# Patient Record
Sex: Male | Born: 1950 | Race: White | Hispanic: No | Marital: Married | Smoking: Former smoker
Health system: Southern US, Community
[De-identification: ages and names within clinical notes are randomized; demographics above are authoritative.]

## PROBLEM LIST (undated history)

## (undated) DIAGNOSIS — K219 Gastro-esophageal reflux disease without esophagitis: Secondary | ICD-10-CM

## (undated) DIAGNOSIS — K921 Melena: Secondary | ICD-10-CM

## (undated) DIAGNOSIS — F411 Generalized anxiety disorder: Secondary | ICD-10-CM

## (undated) DIAGNOSIS — G4730 Sleep apnea, unspecified: Secondary | ICD-10-CM

## (undated) DIAGNOSIS — E785 Hyperlipidemia, unspecified: Secondary | ICD-10-CM

## (undated) DIAGNOSIS — I10 Essential (primary) hypertension: Secondary | ICD-10-CM

## (undated) DIAGNOSIS — T7840XA Allergy, unspecified, initial encounter: Secondary | ICD-10-CM

## (undated) DIAGNOSIS — K429 Umbilical hernia without obstruction or gangrene: Secondary | ICD-10-CM

## (undated) DIAGNOSIS — G4733 Obstructive sleep apnea (adult) (pediatric): Secondary | ICD-10-CM

## (undated) HISTORY — DX: Sleep apnea, unspecified: G47.30

## (undated) HISTORY — DX: Essential (primary) hypertension: I10

## (undated) HISTORY — PX: HAIR TRANSPLANT: SHX1719

## (undated) HISTORY — PX: TONSILLECTOMY AND ADENOIDECTOMY: SUR1326

## (undated) HISTORY — DX: Generalized anxiety disorder: F41.1

## (undated) HISTORY — DX: Allergy, unspecified, initial encounter: T78.40XA

## (undated) HISTORY — PX: OTHER SURGICAL HISTORY: SHX169

## (undated) HISTORY — DX: Gastro-esophageal reflux disease without esophagitis: K21.9

## (undated) HISTORY — DX: Melena: K92.1

## (undated) HISTORY — DX: Obstructive sleep apnea (adult) (pediatric): G47.33

## (undated) HISTORY — PX: EYE SURGERY: SHX253

## (undated) HISTORY — DX: Hyperlipidemia, unspecified: E78.5

## (undated) HISTORY — DX: Umbilical hernia without obstruction or gangrene: K42.9

---

## 2001-05-04 ENCOUNTER — Encounter: Admission: RE | Admit: 2001-05-04 | Discharge: 2001-05-04 | Payer: Self-pay | Admitting: Internal Medicine

## 2001-05-04 ENCOUNTER — Encounter: Payer: Self-pay | Admitting: Internal Medicine

## 2004-11-10 ENCOUNTER — Ambulatory Visit: Payer: Self-pay | Admitting: Internal Medicine

## 2004-12-09 ENCOUNTER — Ambulatory Visit: Payer: Self-pay | Admitting: Gastroenterology

## 2004-12-19 ENCOUNTER — Ambulatory Visit: Payer: Self-pay | Admitting: Gastroenterology

## 2004-12-19 HISTORY — PX: COLONOSCOPY: SHX174

## 2005-11-02 ENCOUNTER — Ambulatory Visit: Payer: Self-pay | Admitting: Internal Medicine

## 2007-06-15 DIAGNOSIS — F411 Generalized anxiety disorder: Secondary | ICD-10-CM

## 2007-06-15 HISTORY — DX: Generalized anxiety disorder: F41.1

## 2007-07-08 ENCOUNTER — Ambulatory Visit: Payer: Self-pay | Admitting: Internal Medicine

## 2007-07-08 DIAGNOSIS — I10 Essential (primary) hypertension: Secondary | ICD-10-CM | POA: Insufficient documentation

## 2007-07-08 DIAGNOSIS — K219 Gastro-esophageal reflux disease without esophagitis: Secondary | ICD-10-CM | POA: Insufficient documentation

## 2007-07-08 DIAGNOSIS — G4733 Obstructive sleep apnea (adult) (pediatric): Secondary | ICD-10-CM

## 2007-07-08 HISTORY — DX: Essential (primary) hypertension: I10

## 2007-07-08 HISTORY — DX: Obstructive sleep apnea (adult) (pediatric): G47.33

## 2007-07-08 HISTORY — DX: Gastro-esophageal reflux disease without esophagitis: K21.9

## 2007-08-01 ENCOUNTER — Ambulatory Visit: Payer: Self-pay | Admitting: Internal Medicine

## 2007-08-01 DIAGNOSIS — K429 Umbilical hernia without obstruction or gangrene: Secondary | ICD-10-CM | POA: Insufficient documentation

## 2007-08-01 HISTORY — DX: Umbilical hernia without obstruction or gangrene: K42.9

## 2007-08-04 ENCOUNTER — Telehealth: Payer: Self-pay | Admitting: Internal Medicine

## 2007-09-05 ENCOUNTER — Ambulatory Visit: Payer: Self-pay | Admitting: Internal Medicine

## 2007-09-06 LAB — CONVERTED CEMR LAB
ALT: 20 units/L (ref 0–53)
AST: 25 units/L (ref 0–37)
Albumin: 4.6 g/dL (ref 3.5–5.2)
Alkaline Phosphatase: 53 units/L (ref 39–117)
BUN: 11 mg/dL (ref 6–23)
Basophils Absolute: 0 10*3/uL (ref 0.0–0.1)
Basophils Relative: 0.6 % (ref 0.0–1.0)
Bilirubin, Direct: 0.2 mg/dL (ref 0.0–0.3)
CO2: 25 meq/L (ref 19–32)
Calcium: 10.1 mg/dL (ref 8.4–10.5)
Chloride: 103 meq/L (ref 96–112)
Creatinine, Ser: 0.9 mg/dL (ref 0.4–1.5)
Eosinophils Absolute: 0.1 10*3/uL (ref 0.0–0.6)
Eosinophils Relative: 2.1 % (ref 0.0–5.0)
GFR calc Af Amer: 112 mL/min
GFR calc non Af Amer: 93 mL/min
Glucose, Bld: 72 mg/dL (ref 70–99)
HCT: 43.5 % (ref 39.0–52.0)
Hemoglobin: 15.2 g/dL (ref 13.0–17.0)
Lymphocytes Relative: 29.6 % (ref 12.0–46.0)
MCHC: 35 g/dL (ref 30.0–36.0)
MCV: 94.7 fL (ref 78.0–100.0)
Monocytes Absolute: 0.6 10*3/uL (ref 0.2–0.7)
Monocytes Relative: 8.7 % (ref 3.0–11.0)
Neutro Abs: 3.9 10*3/uL (ref 1.4–7.7)
Neutrophils Relative %: 59 % (ref 43.0–77.0)
Platelets: 257 10*3/uL (ref 150–400)
Potassium: 4.8 meq/L (ref 3.5–5.1)
RBC: 4.59 M/uL (ref 4.22–5.81)
RDW: 12.9 % (ref 11.5–14.6)
Sodium: 141 meq/L (ref 135–145)
TSH: 0.41 microintl units/mL (ref 0.35–5.50)
Total Bilirubin: 0.8 mg/dL (ref 0.3–1.2)
Total Protein: 7.5 g/dL (ref 6.0–8.3)
WBC: 6.6 10*3/uL (ref 4.5–10.5)

## 2007-10-31 ENCOUNTER — Telehealth: Payer: Self-pay | Admitting: Internal Medicine

## 2007-11-01 ENCOUNTER — Ambulatory Visit: Payer: Self-pay | Admitting: Gastroenterology

## 2007-11-01 LAB — CONVERTED CEMR LAB
ALT: 24 units/L (ref 0–53)
AST: 26 units/L (ref 0–37)
Albumin: 4.7 g/dL (ref 3.5–5.2)
Alkaline Phosphatase: 53 units/L (ref 39–117)
BUN: 10 mg/dL (ref 6–23)
Basophils Absolute: 0.1 10*3/uL (ref 0.0–0.1)
Basophils Relative: 1.5 % — ABNORMAL HIGH (ref 0.0–1.0)
Bilirubin, Direct: 0.1 mg/dL (ref 0.0–0.3)
CO2: 27 meq/L (ref 19–32)
Calcium: 9.9 mg/dL (ref 8.4–10.5)
Chloride: 100 meq/L (ref 96–112)
Creatinine, Ser: 0.9 mg/dL (ref 0.4–1.5)
Eosinophils Absolute: 0.1 10*3/uL (ref 0.0–0.6)
Eosinophils Relative: 1.5 % (ref 0.0–5.0)
GFR calc Af Amer: 112 mL/min
GFR calc non Af Amer: 93 mL/min
Glucose, Bld: 112 mg/dL — ABNORMAL HIGH (ref 70–99)
HCT: 42.5 % (ref 39.0–52.0)
Hemoglobin: 15.1 g/dL (ref 13.0–17.0)
INR: 1.1 — ABNORMAL HIGH (ref 0.8–1.0)
Lymphocytes Relative: 20.7 % (ref 12.0–46.0)
MCHC: 35.6 g/dL (ref 30.0–36.0)
MCV: 94 fL (ref 78.0–100.0)
Monocytes Absolute: 0.5 10*3/uL (ref 0.2–0.7)
Monocytes Relative: 7.6 % (ref 3.0–11.0)
Neutro Abs: 4.7 10*3/uL (ref 1.4–7.7)
Neutrophils Relative %: 68.7 % (ref 43.0–77.0)
Platelets: 210 10*3/uL (ref 150–400)
Potassium: 4.2 meq/L (ref 3.5–5.1)
Prothrombin Time: 12.8 s (ref 10.9–13.3)
RBC: 4.53 M/uL (ref 4.22–5.81)
RDW: 12.9 % (ref 11.5–14.6)
Sed Rate: 16 mm/hr (ref 0–20)
Sodium: 136 meq/L (ref 135–145)
Tissue Transglutaminase Ab, IgA: 0.8 units (ref <7)
Total Bilirubin: 0.9 mg/dL (ref 0.3–1.2)
Total Protein: 7.7 g/dL (ref 6.0–8.3)
WBC: 6.8 10*3/uL (ref 4.5–10.5)

## 2007-11-03 ENCOUNTER — Encounter: Payer: Self-pay | Admitting: Gastroenterology

## 2007-11-18 ENCOUNTER — Encounter: Payer: Self-pay | Admitting: Internal Medicine

## 2008-02-20 ENCOUNTER — Telehealth: Payer: Self-pay | Admitting: Gastroenterology

## 2008-06-13 ENCOUNTER — Telehealth: Payer: Self-pay | Admitting: Internal Medicine

## 2008-09-13 ENCOUNTER — Telehealth: Payer: Self-pay | Admitting: Internal Medicine

## 2008-10-26 HISTORY — PX: UMBILICAL HERNIA REPAIR: SHX196

## 2008-10-31 ENCOUNTER — Ambulatory Visit: Payer: Self-pay | Admitting: Internal Medicine

## 2008-10-31 LAB — CONVERTED CEMR LAB
ALT: 32 units/L (ref 0–53)
AST: 37 units/L (ref 0–37)
Albumin: 4.4 g/dL (ref 3.5–5.2)
Alkaline Phosphatase: 43 units/L (ref 39–117)
BUN: 11 mg/dL (ref 6–23)
Basophils Absolute: 0.1 10*3/uL (ref 0.0–0.1)
Basophils Relative: 0.9 % (ref 0.0–3.0)
Bilirubin, Direct: 0.1 mg/dL (ref 0.0–0.3)
Blood in Urine, dipstick: NEGATIVE
CO2: 29 meq/L (ref 19–32)
Calcium: 9.4 mg/dL (ref 8.4–10.5)
Chloride: 102 meq/L (ref 96–112)
Cholesterol: 215 mg/dL (ref 0–200)
Creatinine, Ser: 0.9 mg/dL (ref 0.4–1.5)
Creatinine,U: 254.6 mg/dL
Direct LDL: 153.7 mg/dL
Eosinophils Absolute: 0.2 10*3/uL (ref 0.0–0.7)
Eosinophils Relative: 2.9 % (ref 0.0–5.0)
GFR calc Af Amer: 112 mL/min
GFR calc non Af Amer: 92 mL/min
Glucose, Bld: 104 mg/dL — ABNORMAL HIGH (ref 70–99)
Glucose, Urine, Semiquant: NEGATIVE
HCT: 40.2 % (ref 39.0–52.0)
HDL: 41.8 mg/dL (ref >39.0)
Hemoglobin: 14.1 g/dL (ref 13.0–17.0)
Hgb A1c MFr Bld: 5.2 % (ref 4.6–6.0)
Ketones, urine, test strip: NEGATIVE
Lymphocytes Relative: 23.9 % (ref 12.0–46.0)
MCHC: 35.1 g/dL (ref 30.0–36.0)
MCV: 94 fL (ref 78.0–100.0)
Microalb Creat Ratio: 22.8 mg/g (ref 0.0–30.0)
Microalb, Ur: 5.8 mg/dL — ABNORMAL HIGH (ref 0.0–1.9)
Monocytes Absolute: 0.7 10*3/uL (ref 0.1–1.0)
Monocytes Relative: 11.6 % (ref 3.0–12.0)
Neutro Abs: 3.3 10*3/uL (ref 1.4–7.7)
Neutrophils Relative %: 60.7 % (ref 43.0–77.0)
Nitrite: NEGATIVE
PSA: 0.67 ng/mL (ref 0.10–4.00)
Platelets: 180 10*3/uL (ref 150–400)
Potassium: 4.1 meq/L (ref 3.5–5.1)
RBC: 4.28 M/uL (ref 4.22–5.81)
RDW: 12.3 % (ref 11.5–14.6)
Sodium: 139 meq/L (ref 135–145)
Specific Gravity, Urine: 1.015
TSH: 1.07 microintl units/mL (ref 0.35–5.50)
Total Bilirubin: 1.1 mg/dL (ref 0.3–1.2)
Total CHOL/HDL Ratio: 5.1
Total Protein: 7.5 g/dL (ref 6.0–8.3)
Triglycerides: 149 mg/dL (ref 0–149)
Urobilinogen, UA: 0.2
VLDL: 30 mg/dL (ref 0–40)
WBC Urine, dipstick: NEGATIVE
WBC: 5.7 10*3/uL (ref 4.5–10.5)
pH: 6.5

## 2008-11-20 ENCOUNTER — Telehealth: Payer: Self-pay | Admitting: Internal Medicine

## 2009-01-28 ENCOUNTER — Encounter: Payer: Self-pay | Admitting: Internal Medicine

## 2009-01-31 ENCOUNTER — Ambulatory Visit (HOSPITAL_BASED_OUTPATIENT_CLINIC_OR_DEPARTMENT_OTHER): Admission: RE | Admit: 2009-01-31 | Discharge: 2009-01-31 | Payer: Self-pay | Admitting: Surgery

## 2009-02-11 ENCOUNTER — Telehealth (INDEPENDENT_AMBULATORY_CARE_PROVIDER_SITE_OTHER): Payer: Self-pay | Admitting: *Deleted

## 2009-02-15 ENCOUNTER — Ambulatory Visit: Payer: Self-pay | Admitting: Family Medicine

## 2009-02-15 DIAGNOSIS — K921 Melena: Secondary | ICD-10-CM

## 2009-02-15 HISTORY — DX: Melena: K92.1

## 2009-02-15 LAB — CONVERTED CEMR LAB
ALT: 23 units/L (ref 0–53)
AST: 23 units/L (ref 0–37)
Albumin: 4 g/dL (ref 3.5–5.2)
Alkaline Phosphatase: 44 units/L (ref 39–117)
Basophils Absolute: 0 10*3/uL (ref 0.0–0.1)
Basophils Relative: 0.4 % (ref 0.0–3.0)
Bilirubin, Direct: 0.1 mg/dL (ref 0.0–0.3)
Eosinophils Absolute: 0.1 10*3/uL (ref 0.0–0.7)
Eosinophils Relative: 2.4 % (ref 0.0–5.0)
HCT: 37.6 % — ABNORMAL LOW (ref 39.0–52.0)
Hemoglobin: 13 g/dL (ref 13.0–17.0)
INR: 1 (ref 0.8–1.0)
Lymphocytes Relative: 26.4 % (ref 12.0–46.0)
Lymphs Abs: 1.6 10*3/uL (ref 0.7–4.0)
MCHC: 34.5 g/dL (ref 30.0–36.0)
MCV: 95 fL (ref 78.0–100.0)
Monocytes Absolute: 0.5 10*3/uL (ref 0.1–1.0)
Monocytes Relative: 8.2 % (ref 3.0–12.0)
Neutro Abs: 4 10*3/uL (ref 1.4–7.7)
Neutrophils Relative %: 62.6 % (ref 43.0–77.0)
Platelets: 231 10*3/uL (ref 150.0–400.0)
Prothrombin Time: 11 s (ref 10.9–13.3)
RBC: 3.96 M/uL — ABNORMAL LOW (ref 4.22–5.81)
RDW: 12.8 % (ref 11.5–14.6)
Total Bilirubin: 0.9 mg/dL (ref 0.3–1.2)
Total Protein: 7.4 g/dL (ref 6.0–8.3)
WBC: 6.2 10*3/uL (ref 4.5–10.5)
aPTT: 30.1 s — ABNORMAL HIGH (ref 21.7–28.8)

## 2009-04-11 ENCOUNTER — Ambulatory Visit: Payer: Self-pay | Admitting: Internal Medicine

## 2009-04-11 DIAGNOSIS — E785 Hyperlipidemia, unspecified: Secondary | ICD-10-CM

## 2009-04-11 HISTORY — DX: Hyperlipidemia, unspecified: E78.5

## 2009-06-18 ENCOUNTER — Encounter: Payer: Self-pay | Admitting: Internal Medicine

## 2009-07-24 ENCOUNTER — Telehealth: Payer: Self-pay | Admitting: Internal Medicine

## 2009-07-24 ENCOUNTER — Encounter: Payer: Self-pay | Admitting: Internal Medicine

## 2009-07-24 ENCOUNTER — Ambulatory Visit (HOSPITAL_BASED_OUTPATIENT_CLINIC_OR_DEPARTMENT_OTHER): Admission: RE | Admit: 2009-07-24 | Discharge: 2009-07-24 | Payer: Self-pay | Admitting: Orthopedic Surgery

## 2010-05-09 ENCOUNTER — Ambulatory Visit: Payer: Self-pay | Admitting: Internal Medicine

## 2010-07-17 ENCOUNTER — Telehealth: Payer: Self-pay | Admitting: *Deleted

## 2010-07-28 ENCOUNTER — Telehealth: Payer: Self-pay | Admitting: *Deleted

## 2010-10-07 ENCOUNTER — Telehealth: Payer: Self-pay | Admitting: Internal Medicine

## 2010-10-07 ENCOUNTER — Ambulatory Visit: Payer: Self-pay | Admitting: Internal Medicine

## 2010-10-07 LAB — CONVERTED CEMR LAB
BUN: 20 mg/dL (ref 6–23)
Bilirubin, Direct: 0.1 mg/dL (ref 0.0–0.3)
Blood, UA: NEGATIVE
CO2: 32 meq/L (ref 19–32)
Chloride: 102 meq/L (ref 96–112)
Cholesterol: 194 mg/dL (ref 0–200)
Creatinine, Ser: 0.9 mg/dL (ref 0.4–1.5)
Eosinophils Absolute: 0.2 10*3/uL (ref 0.0–0.7)
Glucose, Bld: 92 mg/dL (ref 70–99)
Leukocytes, UA: NEGATIVE
MCHC: 34.5 g/dL (ref 30.0–36.0)
MCV: 87.5 fL (ref 78.0–100.0)
Monocytes Absolute: 0.9 10*3/uL (ref 0.1–1.0)
Neutrophils Relative %: 63 % (ref 43.0–77.0)
Nitrite: NEGATIVE
PSA: 0.67 ng/mL (ref 0.10–4.00)
Platelets: 177 10*3/uL (ref 150.0–400.0)
Total Bilirubin: 0.8 mg/dL (ref 0.3–1.2)
Total Protein, Urine: NEGATIVE mg/dL
Total Protein: 6.9 g/dL (ref 6.0–8.3)
Triglycerides: 126 mg/dL (ref 0.0–149.0)

## 2010-10-15 ENCOUNTER — Ambulatory Visit: Payer: Self-pay | Admitting: Internal Medicine

## 2010-10-22 ENCOUNTER — Encounter: Payer: Self-pay | Admitting: Gastroenterology

## 2010-11-25 NOTE — Progress Notes (Signed)
Summary: refill Sertraline HCL  Phone Note Refill Request Call back at [phone] Message from:  spouse---live call  Refills Requested: Medication #1:  SERTRALINE HCL 100 MG TABS 1 by mouth daily [facility]-- wife stated that [facility] does not have anymore refills on file.  Initial call taken by: Warnell Forester,  July 28, 2010 12:55 PM    Prescriptions: SERTRALINE HCL 100 MG TABS (SERTRALINE HCL) 1 by mouth daily  #90 x 2   Entered by:   Kathrynn Speed CMA   Authorized by:   Birdie Sons MD   Signed by:   Kathrynn Speed CMA on 07/28/2010   Method used:   Faxed to ...       [facility] (mail-order)             , [place]         Ph: [phone]       Fax: [phone]   RxID:   [id number]

## 2010-11-25 NOTE — Assessment & Plan Note (Signed)
Summary: DEPRESSION/NJR   Vital Signs:  Patient profile:   60 year old male Height:      70.5 inches (179.07 cm) Weight:      202 pounds (91.82 kg) BMI:     28.68 Temp:     98.5 degrees F (36.94 degrees C) oral Pulse rate:   88 / minute BP sitting:   160 / 96  (left arm) Cuff size:   regular  Vitals Entered By: Josph Macho RMA (May 09, 2010 11:47 AM) CC: Depression/ CF Is Patient Diabetic? No   CC:  Depression/ CF.  History of Present Illness: a lot of stress anxiety wife with breast CA working in [place]--has fallen in love with a [place] woman  not taking amlodipine-not monitoring BP  All other systems reviewed and were negative   Current Problems (verified): 1)  Hyperlipidemia  (ICD-272.4) 2)  Hematochezia  (ICD-578.1) 3)  Diarrhea, Infectious  (ICD-009.2) 4)  Hernia, Umbilical  (ICD-553.1) 5)  Obstructive Sleep Apnea  (ICD-327.23) 6)  Hypertension  (ICD-401.9) 7)  Gerd  (ICD-530.81) 8)  Anxiety  (ICD-300.00)  Current Medications (verified): 1)  Sertraline Hcl 50 Mg Tabs (Sertraline Hcl) .... One By Mouth Daily 2)  Nexium 40 Mg  Cpdr (Esomeprazole Magnesium) .Marland Kitchen.. 1 Once Daily 3)  Amlodipine Besylate 5 Mg  Tabs (Amlodipine Besylate) .... One By Mouth Daily  Allergies (verified): 1)  ! * Horse Serum  Past History:  Past Medical History: Last updated: 04/11/2009 Cough Genital Herpes Anxiety GERD Hypertension Hyperlipidemia  Past Surgical History: Last updated: 02/15/2009 Denies surgical history umbilical hernia repair 4/10  Family History: Last updated: 04/11/2009 Family History of MS Family History of ALS--father Family History Other cancer - esophageal-mother no known hx of htn mother MI in 67s  Social History: Last updated: 09/05/2007 Occupation: Married Former Smoker Alcohol use-yes Regular exercise-no works for [employer] "all of the time"  Risk Factors: Alcohol Use: 3 (09/05/2007) Exercise: no (06/15/2007)  Risk  Factors: Smoking Status: quit (06/15/2007)  Physical Exam  General:  alert and well-developed.   Head:  normocephalic and atraumatic.   Eyes:  pupils equal and pupils round.   Ears:  R ear normal and L ear normal.   Neck:  No deformities, masses, or tenderness noted. Chest Wall:  No deformities, masses, tenderness or gynecomastia noted. Msk:  No deformity or scoliosis noted of thoracic or lumbar spine.   Neurologic:  cranial nerves II-XII intact and gait normal.   Skin:  turgor normal and color normal.   Psych:  normally interactive and good eye contact.     Impression & Recommendations:  Problem # 1:  ANXIETY (ICD-300.00) agitation and anxiety lots of emotional stressors side effects of meds discussed His updated medication list for this problem includes:    Sertraline Hcl 100 Mg Tabs (Sertraline hcl) .Marland Kitchen... 1 by mouth daily    Lorazepam 0.5 Mg Tabs (Lorazepam) .Marland Kitchen... Take 1 tablet by mouth once a day as needed anxiety  Problem # 2:  HYPERTENSION (ICD-401.9) noncompliant with meds---he will resume His updated medication list for this problem includes:    Amlodipine Besylate 5 Mg Tabs (Amlodipine besylate) ..... One by mouth daily  Complete Medication List: 1)  Sertraline Hcl 100 Mg Tabs (Sertraline hcl) .Marland Kitchen.. 1 by mouth daily 2)  Nexium 40 Mg Cpdr (Esomeprazole magnesium) .Marland Kitchen.. 1 once daily 3)  Amlodipine Besylate 5 Mg Tabs (Amlodipine besylate) .... One by mouth daily 4)  Lorazepam 0.5 Mg Tabs (Lorazepam) .... Take 1 tablet by mouth  once a day as needed anxiety Prescriptions: LORAZEPAM 0.5 MG TABS (LORAZEPAM) Take 1 tablet by mouth once a day as needed anxiety  #30 x 1   Entered and Authorized by:   Birdie Sons MD   Signed by:   Birdie Sons MD on 05/09/2010   Method used:   Print then Give to Patient   RxID:   [id number] SERTRALINE HCL 100 MG TABS (SERTRALINE HCL) 1 by mouth daily  #90 x 3   Entered and Authorized by:   Birdie Sons MD   Signed by:   Birdie Sons MD  on 05/09/2010   Method used:   Electronically to        [facility] [phone]* (retail)       [address]       [place], [place]  [address]       Ph: [phone] or [phone]       Fax: [phone]   RxID:   [id number]

## 2010-11-25 NOTE — Progress Notes (Signed)
Summary: Pt says [facility] sending req to [facility] Note Call from Patient   Caller: Patient Summary of Call: Pt called and said [facility] in sending refill req  for Sertraline, Nexium and Amlodipine. Pt just wanted to make Dr. Cato Mulligan aware.  Initial call taken by: Lucy Antigua,  July 17, 2010 2:54 PM  Follow-up for Phone Call        Pt aware that he should have refills on zoloft but the other rx's have been sent to [facility]. Rx's sent to [facility]. Follow-up by: Romualdo Bolk, CMA (AAMA),  July 17, 2010 2:58 PM    Prescriptions: NEXIUM 40 MG  CPDR (ESOMEPRAZOLE MAGNESIUM) 1 once daily  #90 x 3   Entered by:   Romualdo Bolk, CMA (AAMA)   Authorized by:   Birdie Sons MD   Signed by:   Romualdo Bolk, CMA (AAMA) on 07/17/2010   Method used:   Faxed to ...       [facility] (retail)             ,          Ph: [phone]       Fax: [phone]   RxID:   [id number] AMLODIPINE BESYLATE 5 MG  TABS (AMLODIPINE BESYLATE) one by mouth daily  #90 x 3   Entered by:   Romualdo Bolk, CMA (AAMA)   Authorized by:   Birdie Sons MD   Signed by:   Romualdo Bolk, CMA (AAMA) on 07/17/2010   Method used:   Faxed to ...       [facility]* (retail)             ,          Ph: [phone]       Fax: [phone]   RxID:   [id number]

## 2010-11-27 NOTE — Assessment & Plan Note (Signed)
Summary: cpx---will fast per dr Lateefah Mallery//ccm   Vital Signs:  Patient profile:   60 year old male Height:      71 inches Weight:      192 pounds Temp:     98.3 degrees F oral Pulse rate:   92 / minute BP sitting:   102 / 74  (left arm) Cuff size:   regular  Vitals Entered By: Alfred Levins, CMA (October 15, 2010 10:34 AM) CC: cpx   CC:  cpx.  History of Present Illness: CPX  Current Problems (verified): 1)  Hyperlipidemia  (ICD-272.4) 2)  Hematochezia  (ICD-578.1) 3)  Hernia, Umbilical  (ICD-553.1) 4)  Obstructive Sleep Apnea  (ICD-327.23) 5)  Hypertension  (ICD-401.9) 6)  Gerd  (ICD-530.81) 7)  Anxiety  (ICD-300.00)  Current Medications (verified): 1)  Nexium 40 Mg  Cpdr (Esomeprazole Magnesium) .Marland Kitchen.. 1 Once Daily 2)  Amlodipine Besylate 5 Mg  Tabs (Amlodipine Besylate) .... One By Mouth Daily  Allergies (verified): 1)  ! * Horse Serum  Past History:  Past Medical History: Last updated: 04/11/2009 Cough Genital Herpes Anxiety GERD Hypertension Hyperlipidemia  Past Surgical History: Last updated: 02/15/2009 Denies surgical history umbilical hernia repair 4/10  Family History: Last updated: 10/15/2010 Family History of MS Family History of ALS--father Family History Other cancer - esophageal-mother no known hx of htn mother MI in 48s  Social History: Last updated: 10/15/2010 Occupation: Married--- Former Smoker Alcohol use-yes hx of ABUSE- functioning alcoholic Regular exercise-no works for [employer] "all of the time"--retired VF---now works as Research scientist (medical)  Risk Factors: Alcohol Use: 3 (09/05/2007) Exercise: no (06/15/2007)  Risk Factors: Smoking Status: quit (06/15/2007)  Family History: Family History of MS Family History of ALS--father Family History Other cancer - esophageal-mother no known hx of htn mother MI in 47s  Social History: Occupation: Married--- Former Smoker Alcohol use-yes hx of ABUSE- functioning  alcoholic Regular exercise-no works for [employer] "all of the time"--retired VF---now works as Research scientist (medical)  Physical Exam  General:  alert and well-developed.   Head:  normocephalic and atraumatic.   Eyes:  pupils equal and pupils round.   Neck:  No deformities, masses, or tenderness noted. Heart:  normal rate and regular rhythm.   Abdomen:  small incisional scar from recent local hernia repair. No signs of secondary infection. Abdomen soft nontender. No erythema. Skin:  turgor normal and color normal.     Impression & Recommendations:  Problem # 1:  Preventive Health Care (ICD-V70.0) health maint UTD  Problem # 2:  HYPERLIPIDEMIA (ICD-272.4) no Rx at this time Labs Reviewed: SGOT: 17 (10/07/2010)   SGPT: 16 (10/07/2010)   HDL:29.40 (10/07/2010), 41.8 (10/31/2008)  LDL:139 (10/07/2010), DEL (10/31/2008)  Chol:194 (10/07/2010), 215 (10/31/2008)  Trig:126.0 (10/07/2010), 149 (10/31/2008)  Problem # 3:  GERD (ICD-530.81)  chronic recurent GERD Fhx of esophageal CA---reasonable to send for EGD His updated medication list for this problem includes:    Nexium 40 Mg Cpdr (Esomeprazole magnesium) .Marland Kitchen... 1 once daily  Orders: Gastroenterology Referral (GI)  Problem # 4:  HYPERTENSION (ICD-401.9) great control decrease dose to 1/2 His updated medication list for this problem includes:    Amlodipine Besylate 5 Mg Tabs (Amlodipine besylate) .Marland Kitchen... 1/2 by mouth daily  BP today: 102/74 Prior BP: 160/96 (05/09/2010)  Labs Reviewed: K+: 4.7 (10/07/2010) Creat: : 0.9 (10/07/2010)   Chol: 194 (10/07/2010)   HDL: 29.40 (10/07/2010)   LDL: 139 (10/07/2010)   TG: 126.0 (10/07/2010)  Complete Medication List: 1)  Nexium 40 Mg Cpdr (Esomeprazole magnesium) .Marland KitchenMarland KitchenMarland Kitchen  1 once daily 2)  Amlodipine Besylate 5 Mg Tabs (Amlodipine besylate) .... 1/2 by mouth daily   Patient Instructions: 1)  We will call you about EGD   Orders Added: 1)  Gastroenterology Referral [GI] 2)  Est. Patient 40-64  years [99396] 3)  Est. Patient Level III [40981]

## 2010-11-27 NOTE — Progress Notes (Signed)
Summary: wants pancreas lab  Phone Note Call from Patient Call back at [phone]   Caller: Patient---live call Summary of Call: pt would like to know if pancreas lab is incuded with physical labs. Initial call taken by: Warnell Forester,  October 07, 2010 10:02 AM  Follow-up for Phone Call        no.  if he has questions I'll discuss at time of CPX---will draw all labs at that time Follow-up by: Birdie Sons MD,  October 07, 2010 2:10 PM  Additional Follow-up for Phone Call Additional follow up Details #1::        pt informed. Additional Follow-up by: Warnell Forester,  October 07, 2010 2:35 PM

## 2010-11-27 NOTE — Letter (Signed)
Summary: New Patient letter  [facility]  [address] [place], [place] [address]   Phone: [phone]  Fax: [phone]       10/22/2010 MRN: [id number]  [name] [address] [place], [place]  [address]  Dear [name],  Welcome to the Gastroenterology Division at [facility].    You are scheduled to see Dr.  Russella Dar on 12-02-10 at 2:30pm on the 3rd floor at [facility] [address]  We ask that you try to arrive at our office 15 minutes prior to your appointment time to allow for check-in.  We would like you to complete the enclosed self-administered evaluation form prior to your visit and bring it with you on the day of your appointment.  We will review it with you.  Also, please bring a complete list of all your medications or, if you prefer, bring the medication bottles and we will list them.  Please bring your insurance card so that we may make a copy of it.  If your insurance requires a referral to see a specialist, please bring your referral form from your primary care physician.  Co-payments are due at the time of your visit and may be paid by cash, check or credit card.     Your office visit will consist of a consult with your physician (includes a physical exam), any laboratory testing he/she may order, scheduling of any necessary diagnostic testing (e.g. x-ray, ultrasound, CT-scan), and scheduling of a procedure (e.g. Endoscopy, Colonoscopy) if required.  Please allow enough time on your schedule to allow for any/all of these possibilities.    If you cannot keep your appointment, please call [phone] to cancel or reschedule prior to your appointment date.  This allows [place] the opportunity to schedule an appointment for another patient in need of care.  If you do not cancel or reschedule by 5 p.m. the business day prior to your appointment date, you will be charged a $50.00 late cancellation/no-show fee.    Thank you for choosing  [facility] for your medical needs.  We appreciate the opportunity to care for you.  Please visit [place] at our website  to learn more about our practice.                     Sincerely,                                                             The Gastroenterology Division

## 2010-12-02 ENCOUNTER — Ambulatory Visit (INDEPENDENT_AMBULATORY_CARE_PROVIDER_SITE_OTHER): Payer: BC Managed Care – PPO | Admitting: Gastroenterology

## 2010-12-02 ENCOUNTER — Encounter: Payer: Self-pay | Admitting: Gastroenterology

## 2010-12-02 DIAGNOSIS — K219 Gastro-esophageal reflux disease without esophagitis: Secondary | ICD-10-CM

## 2010-12-03 NOTE — Procedures (Signed)
Summary: Colonoscopy   Colonoscopy  Procedure date:  12/19/2004  Findings:      Results: Diverticulosis.       Pathology:  Hyperplastic polyp.     Location:  [facility].    Procedures Next Due Date:    Colonoscopy: 12/2009 Patient Name: [name], [name] MRN:  Procedure Procedures: Colonoscopy CPT: 04540.    with Hot Biopsy(s)CPT: Z451292.  Personnel: Endoscopist: Venita Lick. Russella Dar, MD, Clementeen Graham.  Referred By: Valetta Mole Swords, MD.  Exam Location: Exam performed in Outpatient Clinic. Outpatient  Patient Consent: Procedure, Alternatives, Risks and Benefits discussed, consent obtained, from patient. Consent was obtained by the RN.  Indications  Average Risk Screening Routine.  History  Current Medications: Patient is not currently taking Coumadin.  Pre-Exam Physical: Performed Dec 19, 2004. Entire physical exam was normal.  Exam Exam: Extent of exam reached: Cecum, extent intended: Cecum.  The cecum was identified by appendiceal orifice and IC valve. Colon retroflexion performed. ASA Classification: II. Tolerance: excellent.  Monitoring: Pulse and BP monitoring, Oximetry used. Supplemental O2 given.  Colon Prep Used Miralax for colon prep. Prep results: fair, adequate exam.  Sedation Meds: Patient assessed and found to be appropriate for moderate (conscious) sedation. Fentanyl 75 mcg. given IV. Versed 9 mg. given IV.  Findings DIVERTICULOSIS: Transverse Colon. Not bleeding. ICD9: Diverticulosis: 562.10. Comments: mild and widely scattered.  NORMAL EXAM: Splenic Flexure to Sigmoid Colon.  NORMAL EXAM: Cecum to Hepatic Flexure.  MULTIPLE POLYPS: Rectum. minimum size 2 mm, maximum size 2 mm. Procedure:  hot biopsy, removed, Polyp retrieved, 3 polyps Polyps sent to pathology. ICD9: Colon Polyps: 211.3.   Assessment  Diagnoses: 562.10: Diverticulosis.  211.3: Colon Polyps.   Events  Unplanned Interventions: No intervention was required.    Unplanned Events: There were no complications. Plans  Post Exam Instructions: No aspirin or non-steroidal containing medications: 2 weeks.  Medication Plan: Await pathology. Continue current medications.  Patient Education: Patient given standard instructions for: Polyps. Diverticulosis.  Disposition: After procedure patient sent to recovery. After recovery patient sent home.  Scheduling/Referral: Colonoscopy, to [facility] T. Russella Dar, MD, [facility], if polyp(s) adenomatous, around Dec 19, 2009.    This report was created from the original endoscopy report, which was reviewed and signed by the above listed endoscopist.    cc: Valetta Mole. Swords, MD

## 2010-12-11 NOTE — Assessment & Plan Note (Signed)
Summary: SCREEN FOR ENDO CHRONIC GERD/YF--TERRY [phone]/MAILER SENT C...   History of Present Illness Visit Type: follow up Primary GI MD: Elie Goody MD [facility] Primary Provider: Birdie Sons, MD Requesting Provider: Birdie Sons, MD Chief Complaint: GERD, family hx of esophageal cancer History of Present Illness:   This is a 60 year old man with chronic GERD, who is here today with his wife. He relates problems with reflux for approximately 30-40 years. He states he has taken some type of medication for about 20 years. Recently he has been maintained on Nexium 40 mg daily with excellent symptom control. His mother has a history of esophageal cancer, type unknown.   GI Review of Systems      Denies abdominal pain, acid reflux, belching, bloating, chest pain, dysphagia with liquids, dysphagia with solids, heartburn, loss of appetite, nausea, vomiting, vomiting blood, weight loss, and  weight gain.        Denies anal fissure, black tarry stools, change in bowel habit, constipation, diarrhea, diverticulosis, fecal incontinence, heme positive stool, hemorrhoids, irritable bowel syndrome, jaundice, light color stool, liver problems, rectal bleeding, and  rectal pain.   Current Medications (verified): 1)  Nexium 40 Mg  Cpdr (Esomeprazole Magnesium) .Marland Kitchen.. 1 Once Daily 2)  Amlodipine Besylate 5 Mg  Tabs (Amlodipine Besylate) .... 1/2 By Mouth Daily 3)  Sertraline Hcl 100 Mg Tabs (Sertraline Hcl) .... Take 1 Tablet By Mouth Once Daily  Allergies (verified): 1)  ! * Horse Serum  Past History:  Past Medical History: Cough Genital Herpes Anxiety GERD Hypertension Hyperlipidemia Diverticulosis Alcoholism Sleep Apnea Hyperplastic colon polyp 2006  Past Surgical History: Reviewed history from 11/28/2010 and no changes required. umbilical hernia repair 4/10 T & A Lasik eye surgery Hair transplant  Family History: Reviewed history from 10/15/2010 and no changes required. 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         The pertinent positives and negatives are noted as above and in the HPI. All other ROS were reviewed and were negative.   Vital Signs:  Patient profile:   60 year old male Height:      71 inches Weight:      198.50 pounds BMI:     27.79 Pulse rate:   68 / minute Pulse rhythm:   regular BP sitting:   110 / 72  (left arm) Cuff size:   regular  Vitals Entered By: June McMurray CMA Duncan Dull) (December 02, 2010 2:42 PM)  Physical Exam  General:  Well developed, well nourished, no acute distress. Head:  Normocephalic and atraumatic. Eyes:  PERRLA, no icterus. Ears:  Normal auditory acuity. Mouth:  No deformity or lesions, dentition normal. Lungs:  Clear throughout to auscultation. Heart:  Regular rate and rhythm; no murmurs, rubs,  or bruits. Abdomen:  Soft, nontender and nondistended. No masses, hepatosplenomegaly or hernias noted. Normal bowel sounds. Pulses:  Normal pulses noted. Extremities:  No clubbing, cyanosis, edema or deformities noted. Neurologic:  Alert and  oriented x4;  grossly normal neurologically. Psych:  Alert and cooperative. Normal mood and affect.  Impression & Recommendations:  Problem # 1:  GERD (ICD-530.81)  Chronic GERD. Continue standard antireflux measures and Nexium 40 mg daily. Screen for Barrett's esophagus. The risks, benefits and alternatives to endoscopy with possible biopsy and possible dilation were discussed with the patient and they consent to proceed. The procedure will be scheduled  electively. Orders: EGD (EGD)  Problem # 2:  SCREENING COLORECTAL-CANCER (ICD-V76.51) Average risk for colorectal cancer. Recommend screening colonoscopy in February 2016.  Patient Instructions: 1)  Upper Endoscopy brochure given.  2)  Avoid foods high in acid content ( tomatoes, citrus juices, spicy foods) . Avoid eating within 3 to 4 hours of lying down or before exercising. Do not over eat; try smaller more frequent meals. Elevate head of bed four inches when sleeping.  3)  Copy sent to : Birdie Sons, MD 4)  The medication list was reviewed and reconciled.  All changed / newly prescribed medications were explained.  A complete medication list was provided to the patient / caregiver.

## 2010-12-11 NOTE — Letter (Signed)
Summary: EGD Instructions  [facility]  [address] [place], [place] [address]   Phone: [phone]  Fax: [phone]       [name]    [birth year]    MRN: [id number]       Procedure Day /Date: Monday February 20th, 2012     Arrival Time:  2:00pm     Procedure Time: 3:00pm     Location of Procedure:                    _ x _ [facility] (4th Floor)    PREPARATION FOR ENDOSCOPY   On 12/15/10 THE DAY OF THE PROCEDURE:  1.   No solid foods, milk or milk products are allowed after midnight the night before your procedure.  2.   Do not drink anything colored red or purple.  Avoid juices with pulp.  No orange juice.  3.  You may drink clear liquids until 1:00pm, which is 2 hours before your procedure.                                                                                                CLEAR LIQUIDS INCLUDE: Water Jello Ice Popsicles Tea (sugar ok, no milk/cream) Powdered fruit flavored drinks Coffee (sugar ok, no milk/cream) Gatorade Juice: apple, white grape, white cranberry  Lemonade Clear bullion, consomm, broth Carbonated beverages (any kind) Strained chicken noodle soup Hard Candy   MEDICATION INSTRUCTIONS  Unless otherwise instructed, you should take regular prescription medications with a small sip of water as early as possible the morning of your procedure.        OTHER INSTRUCTIONS  You will need a responsible adult at least 60 years of age to accompany you and drive you home.   This person must remain in the waiting room during your procedure.  Wear loose fitting clothing that is easily removed.  Leave jewelry and other valuables at home.  However, you may wish to bring a book to read or an iPod/MP3 player to listen to music as you wait for your procedure to start.  Remove all body piercing jewelry and leave at home.  Total time from sign-in until discharge is approximately 2-3 hours.  You should go  home directly after your procedure and rest.  You can resume normal activities the day after your procedure.  The day of your procedure you should not:   Drive   Make legal decisions   Operate machinery   Drink alcohol   Return to work  You will receive specific instructions about eating, activities and medications before you leave.    The above instructions have been reviewed and explained to me by   Marchelle Folks.     I fully understand and can verbalize these instructions _____________________________ Date _________

## 2010-12-15 ENCOUNTER — Other Ambulatory Visit (AMBULATORY_SURGERY_CENTER): Payer: BC Managed Care – PPO | Admitting: Gastroenterology

## 2010-12-15 ENCOUNTER — Other Ambulatory Visit: Payer: Self-pay | Admitting: Gastroenterology

## 2010-12-15 DIAGNOSIS — K2270 Barrett's esophagus without dysplasia: Secondary | ICD-10-CM

## 2010-12-15 DIAGNOSIS — K2940 Chronic atrophic gastritis without bleeding: Secondary | ICD-10-CM

## 2010-12-15 DIAGNOSIS — K219 Gastro-esophageal reflux disease without esophagitis: Secondary | ICD-10-CM

## 2010-12-15 DIAGNOSIS — K449 Diaphragmatic hernia without obstruction or gangrene: Secondary | ICD-10-CM

## 2010-12-18 ENCOUNTER — Encounter: Payer: Self-pay | Admitting: Gastroenterology

## 2010-12-23 NOTE — Letter (Signed)
Summary: Patient Notice-Endo Biopsy Results  [facility]  [address] [place], [place] [address]   Phone: [phone]  Fax: [phone]        December 18, 2010 MRN: [id number]    [name] [address] [place], [place]  [address]    Dear [name],  I am pleased to inform you that the biopsies taken during your recent endoscopic examination did not show any evidence of cancer upon pathologic examination.   Good news. The biopsies showed only chronic inflammation, likely from GERD. Barrett's esophagus was NOT noted.  Continue with the treatment plan as outlined on the day of your      exam.  Please call us if you are having persistent problems or have questions about your condition that have not been fully answered at this time.  Sincerely,  Meryl Dare MD [facility]  This letter has been electronically signed by your physician.  Appended Document: Patient Notice-Endo Biopsy Results letter mailed

## 2010-12-23 NOTE — Procedures (Signed)
Summary: Upper Endoscopy  Patient: Indio Santilli Note: All result statuses are Final unless otherwise noted.  Tests: (1) Upper Endoscopy (EGD)   EGD Upper Endoscopy       DONE     [facility]     [address]     [place], [place]  [address]           ENDOSCOPY PROCEDURE REPORT           PATIENT:  Buzz, Axel  MR#:  [id number]     BIRTHDATE:  [birth year], 60 yrs. old  GENDER:  male     ENDOSCOPIST:  Judie Petit T. Russella Dar, MD, [facility]           PROCEDURE DATE:  12/15/2010     PROCEDURE:  EGD with biopsy, [id number]     ASA CLASS:  Class II     INDICATIONS:  GERD     MEDICATIONS:  Fentanyl 75 mcg IV, Versed 7 mg IV, Benadryl 12.5 mg     IV     TOPICAL ANESTHETIC:  Exactacain Spray     DESCRIPTION OF PROCEDURE:   After the risks benefits and     alternatives of the procedure were thoroughly explained, informed     consent was obtained.  The LB GIF-H180 [id number] endoscope was     introduced through the mouth and advanced to the second portion of     the duodenum, without limitations.  The instrument was slowly     withdrawn as the mucosa was fully examined.     <<PROCEDUREIMAGES>>     There were columnar-type mucosal changes in the distal esophagus,     that could represent Barrett's esophagus. It was 1 - 2 cm in     length. Multiple biopsies were obtained and sent to pathology.     Otherwise normal esophagus. The stomach was entered and closely     examined. The pylorus, antrum, angularis, and lesser curvature     were well visualized, including a retroflexed view of the cardia     and fundus. The stomach wall was normally distensable. The scope     passed easily through the pylorus into the duodenum. The duodenal     bulb was normal in appearance, as was the postbulbar duodenum.     Retroflexed views revealed a hiatal hernia, small. The scope was     then withdrawn from the patient and the procedure completed.           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 Russella Dar, MD, [facility]           CC:  Lindley Magnus, MD           n.     Rosalie DoctorVenita Lick. Joshua Soulier at 12/15/2010 02:57 PM           Milinda Pointer, [id number]  Note: An exclamation mark (!) indicates a result that was  not dispersed into the flowsheet. Document Creation Date: 12/15/2010 2:57 PM _______________________________________________________________________  (1) Order result status: Final Collection or observation date-time: 12/15/2010 14:52 Requested date-time:  Receipt date-time:  Reported date-time:  Referring Physician:   Ordering Physician: Claudette Head [phone]) Specimen Source:  Source: Launa Grill Order Number: [id number] Lab site:

## 2011-01-27 ENCOUNTER — Encounter: Payer: Self-pay | Admitting: Family Medicine

## 2011-01-27 ENCOUNTER — Other Ambulatory Visit: Payer: Self-pay | Admitting: *Deleted

## 2011-01-27 ENCOUNTER — Ambulatory Visit (INDEPENDENT_AMBULATORY_CARE_PROVIDER_SITE_OTHER): Payer: BC Managed Care – PPO | Admitting: Family Medicine

## 2011-01-27 VITALS — BP 110/70 | Temp 98.5°F | Ht 70.75 in | Wt 199.0 lb

## 2011-01-27 DIAGNOSIS — F411 Generalized anxiety disorder: Secondary | ICD-10-CM

## 2011-01-27 DIAGNOSIS — F419 Anxiety disorder, unspecified: Secondary | ICD-10-CM

## 2011-01-27 DIAGNOSIS — G4700 Insomnia, unspecified: Secondary | ICD-10-CM

## 2011-01-27 MED ORDER — CLONAZEPAM 0.5 MG PO TABS: 0.5000 mg | ORAL_TABLET | Freq: Two times a day (BID) | ORAL | Status: AC | PRN

## 2011-01-27 NOTE — Progress Notes (Signed)
  Subjective:    Patient ID: [name], male    DOB: 03/01/1951, 60 y.o.   MRN: [id number]  HPI Patient seen with chief complaint of anxiety. He has a prior history of depression and anxiety and also past history of alcohol misuse. Multiple stressors with loss of job and is estranged from wife during the past year and half. Underwent alcohol rehabilitation last summer. On sertraline 100 mg daily. He has been totally abstinent since last July. He had extensive counseling which is ongoing and exercising most days a week. He has pervasive anxiety which prevents sleep and frequently during the day. He feels depression is stable. He has not seen improvement in anxiety symptoms with increased dose of sertraline.  His anxiety symptoms have been very disabling.     Review of Systems  Constitutional: Negative for fatigue.  Respiratory: Negative for cough and shortness of breath.   Cardiovascular: Negative for chest pain and palpitations.  Gastrointestinal: Negative for abdominal pain.  Neurological: Negative for headaches.  Psychiatric/Behavioral: Positive for sleep disturbance. Negative for suicidal ideas, confusion, dysphoric mood and agitation. The patient is nervous/anxious.        Objective:   Physical Exam  Constitutional: He is oriented to person, place, and time. He appears well-developed and well-nourished.  Cardiovascular: Normal rate, regular rhythm and normal heart sounds.   No murmur heard. Pulmonary/Chest: Effort normal and breath sounds normal. No respiratory distress. He has no wheezes. He has no rales.  Neurological: He is alert and oriented to person, place, and time.  Psychiatric: He has a normal mood and affect.          Assessment & Plan:  #1 increased anxiety symptoms. Continue sertraline 100 mg daily. Had long discussion regarding options.  Continue with counseling and regular exercise. We talked about concerns with benzodiazepines with his prior history but  agree after long discussion of pros and cons to try low-dose Klonopin 0.5 mg twice a day #60 with 1 refill. He is encouraged to follow up with primary physician.  He specifically inquired about use of Xanax and we have recommended against with short half life and increased risk of abuse. #2 Chronic insomnia.  Sleep hygiene discussed.

## 2011-01-27 NOTE — Telephone Encounter (Signed)
Pt was given Lunesta and he didn't like it.  Wanted to know if he could get a rx for xanax

## 2011-01-29 NOTE — Telephone Encounter (Signed)
Xanax is not a very good sleeping medication Trial trazodone 50 mg. 1/2-1 po qhs prn insmonia

## 2011-01-30 LAB — BASIC METABOLIC PANEL
BUN: 13 mg/dL (ref 6–23)
CO2: 24 mEq/L (ref 19–32)
Calcium: 8.9 mg/dL (ref 8.4–10.5)
Chloride: 104 mEq/L (ref 96–112)
Creatinine, Ser: 0.81 mg/dL (ref 0.4–1.5)
GFR calc Af Amer: >60 mL/min (ref >60)
Glucose, Bld: 91 mg/dL (ref 70–99)

## 2011-02-02 MED ORDER — TRAZODONE HCL 50 MG PO TABS: 50.0000 mg | ORAL_TABLET | Freq: Every day | ORAL | Status: AC

## 2011-02-02 NOTE — Telephone Encounter (Signed)
rx sent in electronically, pt aware 

## 2011-02-04 LAB — BASIC METABOLIC PANEL
CO2: 26 mEq/L (ref 19–32)
Chloride: 103 mEq/L (ref 96–112)
Creatinine, Ser: 0.77 mg/dL (ref 0.4–1.5)
GFR calc Af Amer: >60 mL/min (ref >60)
Glucose, Bld: 117 mg/dL — ABNORMAL HIGH (ref 70–99)

## 2011-03-10 NOTE — Assessment & Plan Note (Signed)
[facility]                         GASTROENTEROLOGY OFFICE NOTE   ERRIK, MITCHELLE                  MRN:          [id number]  DATE:11/01/2007                            DOB:          [birth year]    PROBLEM:  Chronic diarrhea.   HISTORY OF PRESENT ILLNESS:  [name] is a pleasant 60 year old white  male known to Dr. Russella Dar from prior colonoscopy which was done for  routine screening in 2006, and did show evidence of left colon  diverticulosis.  He had multiple small colon polyps which were removed,  these were hyperplastic.  He has not been seen in the office since that  time.  He does have a history of hypertension.  He is status post  previous umbilical repair.  At this time he presents for evaluation of  diarrhea which he says has been present over at least the past 2 years.  However, it has been particularly noticeably worse since October of  2008.  He says he is usually having 5-6 bowel movements per day,  generally always loose, on rare occasion may have a formed stool.  He  have postprandial urgency.  He denies any nocturnal diarrhea, is not  having any abdominal cramping or discomfort.  His appetite has been  fine. He denies any nausea or vomiting.  He does feel that he has  increased gas and no associated bloating, etc.  He has lost about 15  pounds since October of 2008 but he says most of this has been  intentional with diet and exercise.  Patient has been noticing  intermittent bright red blood mixed with his bowel movements over the  past couple of weeks.   He has not been on any new medications other than Nexium which was just  started within the past month.  He cannot recall any antibiotics over  the past 6 months other than what he was given by Dr. Cato Mulligan for his  diarrhea.  Interestingly, he does travel for business to [place]  on a regular basis, usually goes monthly but says that he has been doing  that for years and  has never gotten sick.  He sticks to bottled water,  etc.  He had been seen by Dr. Cato Mulligan in November of 2008 and had some  labs drawn and was also given a course of metronidazole 500 t.i.d., I  believe, for 7 days.  Labs at that time were drawn but I do not have the  results of those labs available today.  He does not recall doing stool  cultures.   CURRENT MEDICATIONS:  1. Sertraline 50 mg daily.  2. Lisinopril 20 mg daily.  3. Nexium 40 every other day.   ALLERGIES:  No known drug allergies.   PAST MEDICAL HISTORY:  As outlined above.  In addition he has had a hair  transplant. LASIK eye surgery.   FAMILY HISTORY:  Positive for heart disease, diabetes and alcoholism.  His mother died secondary to complications of esophageal cancer.   SOCIAL HISTORY:  The patient is married, has two girls.  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  He  quit smoking about 1-1/2 years ago.  Does drink one bottle of wine per  day.   REVIEW OF SYSTEMS:  Pertinent for allergy/sinus symptoms.  Intermittent  cough. Insomnia and anxiety.  Otherwise review of systems completely  negative.   PHYSICAL EXAMINATION:  GENERAL:  Well-developed white male in no acute  distress. Height is 6 feet, weight is 192.4. Blood pressure 150/90.  Pulse is 84.  HEENT:  Atraumatic, normocephalic. EOMI, PERLA, sclerae anicteric,  conjunctivae are injected.  CARDIOVASCULAR:  Regular rate and rhythm with S1 and S2, no murmurs,  rubs, or gallops.  PULMONARY:  Clear to A&P.  ABDOMEN:  Soft and nontender with no palpable mass or  hepatosplenomegaly.  RECTAL EXAM:  Reveals brown stool which is actually Hemoccult negative.  EXTREMITIES:  Without clubbing, cyanosis or edema, there is no definite  stigmata of chronic liver disease.   IMPRESSION:  37. A 60 year old white male with chronic diarrhea, progressive over      the past 3 months with no intermittent hematochezia. Given his       frequent travel history would still be concerned about a possible      infectious etiology.  Cannot rule out inflammatory bowel disease      with his heavy ETOH use, would also wonder about pancreatic      insufficiency.  2. Chronic ETOH use.  3. Hypertension.  4. Chronic anxiety.   PLAN:  1. Check CBC, C-MET, sedimentation rate, PT, PTT, TTG today and a      stool for C&S, O&P, C. difficile, stool for lactoferrin, stool for      fat.  2. Trial of Flagyl 250 x4 times 14 days.  Florastor one p.o. b.i.d. x8      days, and Imodium one p.o. q.a.m. and then p.r.n. thereafter each      day for diarrhea.  If his cultures and labs are unrevealing and his      symptoms persist after empiric Flagyl treatment he will need      colonoscopy and he will follow up with Dr. Russella Dar in 4-5 weeks when      he returns from his next trip to [place].      Mike Gip, PA-C  Electronically Signed      Rachael Fee, MD  Electronically Signed   AE/MedQ  DD: 11/01/2007  DT: 11/01/2007  Job #: [id number]   cc:   Venita Lick. Russella Dar, MD, Hollace Kinnier H. Swords, MD

## 2011-03-10 NOTE — Op Note (Signed)
NAME:  [name], [name] NO.:  [id number]   MEDICAL RECORD NO.:  [id number]          PATIENT TYPE:  AMB   LOCATION:  [facility]                          FACILITY:  [facility]   PHYSICIAN:  Currie Paris, M.D.DATE OF BIRTH:  10/11/1951   DATE OF PROCEDURE:  01/31/2009  DATE OF DISCHARGE:                               OPERATIVE REPORT   PREOPERATIVE DIAGNOSIS:  Umbilical hernia.   POSTOPERATIVE DIAGNOSIS:  Umbilical hernia.   OPERATION:  Repair of umbilical hernia with mesh.   SURGEON:  Currie Paris, MD   ANESTHESIA:  General.   CLINICAL HISTORY:  This is a 60 year old gentleman with a small  umbilical hernia that he desired to have repaired.   DESCRIPTION OF PROCEDURE:  The patient was seen in the holding area and  he had no further questions.  We confirmed the surgery as noted above.   The patient was taken to the operating room and after satisfactory  general anesthesia had been obtained, the abdomen was clipped, prepped  and draped.  Time-out was done.   The area of the umbilicus was infiltrated with 0.25% plain Marcaine to  help with postop pain control.  I made a short curvilinear incision at  the base of the umbilicus and elevated the skin off the hernia sac.  There was some preperitoneal fat that was coming through the defect and  had actually protruded more towards the superior aspect of the umbilical  skin.  I elevated the skin so I could get this completely reduced and  this all appeared to be some preperitoneal fat.   I was able to then clean off the fascia and get the hernia completely  reduced.  The defect was about 1-1.5 cm and would not admit my  fingertip.   I put some more Marcaine into the deeper tissues.  I took a small piece  of mesh and fashioned it into a cone shaped plug and put this in and  then closed the defect with three sutures of 0 Prolene which tied down  easily and with no tension.   I made sure everything was dry and  then put some subcutaneous 3-0 Vicryl  to tack the umbilical skin down to the fascia to recreate a innyand  then closed the skin with some 4-0 Monocryl subcuticular plus Dermabond.   The patient tolerated the procedure well.  There were no operative  complications.  All counts were correct.      Currie Paris, M.D.  Electronically Signed     CJS/MEDQ  D:  01/31/2009  T:  02/01/2009  Job:  [id number]   cc:   Valetta Mole. Swords, MD

## 2011-04-20 ENCOUNTER — Other Ambulatory Visit: Payer: Self-pay | Admitting: Internal Medicine

## 2011-05-11 ENCOUNTER — Telehealth: Payer: Self-pay | Admitting: *Deleted

## 2011-05-11 NOTE — Telephone Encounter (Signed)
0.25  #30  

## 2011-05-11 NOTE — Telephone Encounter (Signed)
Pt is still having insomnia.  Klonopin and Trazodone don't work.  Wants a rx for xanax cause it won't give him a hangover feeling

## 2011-05-12 ENCOUNTER — Telehealth: Payer: Self-pay | Admitting: *Deleted

## 2011-05-12 MED ORDER — ACYCLOVIR 800 MG PO TABS: 800.0000 mg | ORAL_TABLET | Freq: Four times a day (QID) | ORAL | Status: DC

## 2011-05-12 MED ORDER — ALPRAZOLAM 1 MG PO TABS: 1.0000 mg | ORAL_TABLET | Freq: Every evening | ORAL | Status: DC | PRN

## 2011-05-12 MED ORDER — ALPRAZOLAM 0.25 MG PO TABS: 0.2500 mg | ORAL_TABLET | Freq: Every evening | ORAL | Status: DC | PRN

## 2011-05-12 NOTE — Telephone Encounter (Signed)
rx called in, cancelled xanax 0.25 mg, pt aware

## 2011-05-12 NOTE — Telephone Encounter (Signed)
Called in xanax 0.25mg  qhs but pt wants 1 mg.  Please advise

## 2011-05-12 NOTE — Telephone Encounter (Signed)
Okay for 1 mg but change the instructions to take 1/2-1 mg twice daily as needed

## 2011-05-12 NOTE — Telephone Encounter (Signed)
rx called in, pt aware 

## 2011-06-16 ENCOUNTER — Other Ambulatory Visit: Payer: Self-pay | Admitting: *Deleted

## 2011-06-16 MED ORDER — ALPRAZOLAM 1 MG PO TABS: 1.0000 mg | ORAL_TABLET | Freq: Every evening | ORAL | Status: DC | PRN

## 2011-08-28 ENCOUNTER — Telehealth: Payer: Self-pay | Admitting: Internal Medicine

## 2011-08-28 NOTE — Telephone Encounter (Signed)
Pt is changing pharmacies. Pt needs new script written for esomeprazole (NEXIUM) 40 MG capsule,  sertraline (ZOLOFT) 100 MG tablet, Alprazolam to [facility] [phone]  And the FAX#  [phone] escript to [facility] [address] [address] [place] , [place] [address]

## 2011-08-31 MED ORDER — SERTRALINE HCL 100 MG PO TABS: 100.0000 mg | ORAL_TABLET | Freq: Every day | ORAL | Status: DC

## 2011-08-31 MED ORDER — ESOMEPRAZOLE MAGNESIUM 40 MG PO CPDR: 40.0000 mg | DELAYED_RELEASE_CAPSULE | Freq: Every day | ORAL | Status: DC

## 2011-08-31 MED ORDER — ALPRAZOLAM 1 MG PO TABS: 1.0000 mg | ORAL_TABLET | Freq: Every evening | ORAL | Status: DC | PRN

## 2011-08-31 NOTE — Telephone Encounter (Signed)
rxs faxed

## 2011-10-14 ENCOUNTER — Other Ambulatory Visit: Payer: Self-pay | Admitting: Internal Medicine

## 2011-11-16 ENCOUNTER — Telehealth: Payer: Self-pay | Admitting: Internal Medicine

## 2011-11-16 ENCOUNTER — Other Ambulatory Visit: Payer: Self-pay | Admitting: Internal Medicine

## 2011-11-16 NOTE — Telephone Encounter (Signed)
Pt need new rxs nexium 40 mg,sertraline 100mg  and generic alprazolam 1mg  #90 each rx with 3 refills call into [facility] [phone]

## 2011-11-16 NOTE — Telephone Encounter (Signed)
Pt needs an appt first. 

## 2011-11-17 NOTE — Telephone Encounter (Signed)
lmom for pt to call back

## 2011-11-18 ENCOUNTER — Other Ambulatory Visit: Payer: Self-pay | Admitting: Internal Medicine

## 2011-11-20 NOTE — Telephone Encounter (Signed)
Pt is sch for 12-14-11 845am

## 2011-12-14 ENCOUNTER — Ambulatory Visit (INDEPENDENT_AMBULATORY_CARE_PROVIDER_SITE_OTHER): Payer: PRIVATE HEALTH INSURANCE | Admitting: Internal Medicine

## 2011-12-14 ENCOUNTER — Other Ambulatory Visit: Payer: Self-pay | Admitting: Internal Medicine

## 2011-12-14 ENCOUNTER — Encounter: Payer: Self-pay | Admitting: Internal Medicine

## 2011-12-14 VITALS — BP 170/100 | HR 88 | Temp 98.2°F | Wt 210.0 lb

## 2011-12-14 DIAGNOSIS — I10 Essential (primary) hypertension: Secondary | ICD-10-CM

## 2011-12-14 LAB — LIPID PANEL
Cholesterol: 225 mg/dL — ABNORMAL HIGH (ref 0–200)
HDL: 40.9 mg/dL (ref >39.00)
Total CHOL/HDL Ratio: 6
Triglycerides: 274 mg/dL — ABNORMAL HIGH (ref 0.0–149.0)

## 2011-12-14 LAB — BASIC METABOLIC PANEL
Chloride: 102 mEq/L (ref 96–112)
Creatinine, Ser: 0.8 mg/dL (ref 0.4–1.5)
Potassium: 4.3 mEq/L (ref 3.5–5.1)
Sodium: 137 mEq/L (ref 135–145)

## 2011-12-14 MED ORDER — AMLODIPINE BESYLATE 5 MG PO TABS: 5.0000 mg | ORAL_TABLET | Freq: Every day | ORAL | Status: DC

## 2011-12-14 NOTE — Assessment & Plan Note (Signed)
Not controlled Resume amlodipine Check labs today BP check in one month

## 2011-12-14 NOTE — Progress Notes (Signed)
Patient ID: [name], male   DOB: [birth year], 61 y.o.   MRN: [id number]  HTN-- Not taking meds: He has gaine approx 20 pounds No home BPs  Past Medical History  Diagnosis Date  . HYPERLIPIDEMIA 04/11/2009  . ANXIETY 06/15/2007  . OBSTRUCTIVE SLEEP APNEA 07/08/2007  . HYPERTENSION 07/08/2007  . GERD 07/08/2007  . HERNIA, UMBILICAL 08/01/2007  . HEMATOCHEZIA 02/15/2009    History   Social History  . Marital Status: Married    Spouse Name: N/A    Number of Children: N/A  . Years of Education: N/A   Occupational History  . Not on file.   Social History Main Topics  . Smoking status: Former Smoker -- 1.5 packs/day for 20 years    Types: Cigarettes    Quit date: 01/26/2001  . Smokeless tobacco: Not on file  . Alcohol Use: Not on file  . Drug Use: Not on file  . Sexually Active: Not on file   Other Topics Concern  . Not on file   Social History Narrative  . No narrative on file    Past Surgical History  Procedure Date  . Umbilical hernia repair 2010  . Tonsillectomy and adenoidectomy   . Eye surgery     lasik  . Hair transplant     Family History  Problem Relation Age of Onset  . Cancer Mother     esophageal  . Heart disease Mother 71    MI  . Alzheimer's disease Father     Allergies  Allergen Reactions  . Horse-Derived Products     REACTION: rash    Current Outpatient Prescriptions on File Prior to Visit  Medication Sig Dispense Refill  . acyclovir (ZOVIRAX) 800 MG tablet Take 1 tablet (800 mg total) by mouth 4 (four) times daily.  20 tablet  3  . NEXIUM 40 MG capsule TAKE ONE CAPSULE BY MOUTH DAILY BEFORE BREAKFAST  90 capsule  0  . sertraline (ZOLOFT) 100 MG tablet TAKE 1 TABLET BY MOUTH DAILY  90 tablet  0  . ALPRAZolam (XANAX) 1 MG tablet Take 1 tablet (1 mg total) by mouth at bedtime as needed for sleep.  90 tablet  0     patient denies chest pain, shortness of breath, orthopnea. Denies lower extremity edema, abdominal pain, change in  appetite, change in bowel movements. Patient denies rashes, musculoskeletal complaints. No other specific complaints in a complete review of systems.   BP 170/100  Pulse 88  Temp 98.2 F (36.8 C)  Wt 210 lb (95.255 kg)  well-developed well-nourished male in no acute distress. HEENT exam atraumatic, normocephalic, neck supple without jugular venous distention. Chest clear to auscultation cardiac exam S1-S2 are regular. Abdominal exam overweight with bowel sounds, soft and nontender. Extremities no edema. Neurologic exam is alert with a normal gait.

## 2012-02-10 ENCOUNTER — Telehealth: Payer: Self-pay | Admitting: *Deleted

## 2012-02-10 NOTE — Telephone Encounter (Signed)
Pt requesting a 90 day supply of alprazolam to be sent to [facility].

## 2012-02-12 MED ORDER — ALPRAZOLAM 1 MG PO TABS: 1.0000 mg | ORAL_TABLET | Freq: Every evening | ORAL | Status: DC | PRN

## 2012-02-12 NOTE — Telephone Encounter (Signed)
rx called in, pt aware 

## 2012-04-29 ENCOUNTER — Other Ambulatory Visit: Payer: Self-pay | Admitting: Internal Medicine

## 2012-07-03 ENCOUNTER — Other Ambulatory Visit: Payer: Self-pay | Admitting: Internal Medicine

## 2012-07-27 ENCOUNTER — Other Ambulatory Visit: Payer: Self-pay | Admitting: Internal Medicine

## 2012-07-27 NOTE — Telephone Encounter (Signed)
Refill

## 2012-07-29 NOTE — Telephone Encounter (Signed)
Refill #15/0 refills

## 2012-09-13 ENCOUNTER — Other Ambulatory Visit: Payer: Self-pay | Admitting: Internal Medicine

## 2012-10-07 ENCOUNTER — Telehealth: Payer: Self-pay | Admitting: Internal Medicine

## 2012-10-07 DIAGNOSIS — F411 Generalized anxiety disorder: Secondary | ICD-10-CM

## 2012-10-07 MED ORDER — SERTRALINE HCL 100 MG PO TABS: 100.0000 mg | ORAL_TABLET | Freq: Every day | ORAL | Status: DC

## 2012-10-07 NOTE — Telephone Encounter (Signed)
rx called in, pt will need OV before any more refills given, [facility] will notify pt

## 2012-10-07 NOTE — Telephone Encounter (Signed)
[place] is out of the country right now, however, he needs to rx refilled before the end of the year - Zoloft & Xanax. He is out of Zoloft. Please send to the [facility] in his chart. Thanks.

## 2012-10-16 ENCOUNTER — Other Ambulatory Visit: Payer: Self-pay | Admitting: Internal Medicine

## 2012-10-24 ENCOUNTER — Encounter: Payer: Self-pay | Admitting: Family

## 2012-10-24 ENCOUNTER — Ambulatory Visit (INDEPENDENT_AMBULATORY_CARE_PROVIDER_SITE_OTHER): Payer: PRIVATE HEALTH INSURANCE | Admitting: Family

## 2012-10-24 VITALS — BP 144/90 | HR 71 | Temp 98.0°F | Wt 205.0 lb

## 2012-10-24 DIAGNOSIS — F329 Major depressive disorder, single episode, unspecified: Secondary | ICD-10-CM

## 2012-10-24 DIAGNOSIS — Z23 Encounter for immunization: Secondary | ICD-10-CM

## 2012-10-24 DIAGNOSIS — K219 Gastro-esophageal reflux disease without esophagitis: Secondary | ICD-10-CM

## 2012-10-24 DIAGNOSIS — F411 Generalized anxiety disorder: Secondary | ICD-10-CM

## 2012-10-24 DIAGNOSIS — I10 Essential (primary) hypertension: Secondary | ICD-10-CM

## 2012-10-24 LAB — COMPREHENSIVE METABOLIC PANEL
ALT: 19 U/L (ref 0–53)
AST: 17 U/L (ref 0–37)
Alkaline Phosphatase: 47 U/L (ref 39–117)
Calcium: 9.6 mg/dL (ref 8.4–10.5)
Chloride: 102 mEq/L (ref 96–112)
Creatinine, Ser: 0.8 mg/dL (ref 0.4–1.5)

## 2012-10-24 LAB — LIPID PANEL
LDL Cholesterol: 116 mg/dL — ABNORMAL HIGH (ref 0–99)
Total CHOL/HDL Ratio: 6
Triglycerides: 125 mg/dL (ref 0.0–149.0)
VLDL: 25 mg/dL (ref 0.0–40.0)

## 2012-10-24 MED ORDER — ESOMEPRAZOLE MAGNESIUM 40 MG PO CPDR: 40.0000 mg | DELAYED_RELEASE_CAPSULE | Freq: Every day | ORAL | Status: DC

## 2012-10-24 MED ORDER — ALPRAZOLAM 1 MG PO TABS: 1.0000 mg | ORAL_TABLET | Freq: Every evening | ORAL | Status: DC | PRN

## 2012-10-24 MED ORDER — SERTRALINE HCL 100 MG PO TABS: 100.0000 mg | ORAL_TABLET | Freq: Every day | ORAL | Status: DC

## 2012-10-24 NOTE — Progress Notes (Signed)
Subjective:    Patient ID: [name], male    DOB: 11-Dec-1950, 61 y.o.   MRN: [id number]  HPI 61 year old white male, nonsmoker, is in for recheck of hypertension, anxiety, GERD, insomnia, depression. He takes Xanax to help with insomnia at night. He's an international traveler and has difficulty sleeping. Is currently stable on all medications.   Review of Systems  Constitutional: Negative.   HENT: Negative.   Respiratory: Negative.   Cardiovascular: Negative.   Gastrointestinal: Negative.   Musculoskeletal: Negative.   Skin: Negative.   Neurological: Negative.   Hematological: Negative.   Psychiatric/Behavioral: Negative.    Past Medical History  Diagnosis Date  . HYPERLIPIDEMIA 04/11/2009  . ANXIETY 06/15/2007  . OBSTRUCTIVE SLEEP APNEA 07/08/2007  . HYPERTENSION 07/08/2007  . GERD 07/08/2007  . HERNIA, UMBILICAL 08/01/2007  . HEMATOCHEZIA 02/15/2009    History   Social History  . Marital Status: Married    Spouse Name: N/A    Number of Children: N/A  . Years of Education: N/A   Occupational History  . Not on file.   Social History Main Topics  . Smoking status: Former Smoker -- 1.5 packs/day for 20 years    Types: Cigarettes    Quit date: 01/26/2001  . Smokeless tobacco: Not on file  . Alcohol Use: Not on file  . Drug Use: Not on file  . Sexually Active: Not on file   Other Topics Concern  . Not on file   Social History Narrative  . No narrative on file    Past Surgical History  Procedure Date  . Umbilical hernia repair 2010  . Tonsillectomy and adenoidectomy   . Eye surgery     lasik  . Hair transplant     Family History  Problem Relation Age of Onset  . Cancer Mother     esophageal  . Heart disease Mother 57    MI  . Alzheimer's disease Father     Allergies  Allergen Reactions  . Horse-Derived Products     REACTION: rash    Current Outpatient Prescriptions on File Prior to Visit  Medication Sig Dispense Refill  . acyclovir  (ZOVIRAX) 800 MG tablet take 1 tablet by mouth four times a day  120 tablet  3  . amLODipine (NORVASC) 5 MG tablet Take 1 tablet (5 mg total) by mouth daily.  90 tablet  3  . sertraline (ZOLOFT) 100 MG tablet Take 1 tablet (100 mg total) by mouth daily.  90 tablet  1  . esomeprazole (NEXIUM) 40 MG capsule Take 1 capsule (40 mg total) by mouth daily before breakfast.  90 capsule  1    BP 144/90  Pulse 71  Temp 98 F (36.7 C) (Oral)  Wt 205 lb (92.987 kg)  SpO2 98%chart    Objective:   Physical Exam  Constitutional: He is oriented to person, place, and time. He appears well-developed and well-nourished.  HENT:  Right Ear: External ear normal.  Left Ear: External ear normal.  Nose: Nose normal.  Mouth/Throat: Oropharynx is clear and moist.  Neck: Normal range of motion. Neck supple.  Cardiovascular: Normal rate, regular rhythm and normal heart sounds.   Pulmonary/Chest: Effort normal and breath sounds normal.  Abdominal: Soft. Bowel sounds are normal.  Musculoskeletal: Normal range of motion.  Neurological: He is alert and oriented to person, place, and time.  Skin: Skin is warm and dry.  Psychiatric: He has a normal mood and affect.      Influenza  vaccine administered    Assessment & Plan:  Assessment: Hypertension, hyperlipidemia, GERD, anxiety, insomnia  Plan: Meds refilled. Labs discussed. Obtained to include lipids, CMP, CBC. Will notify patient pending results. Encouraged healthy diet and exercise. Encouraged cholesterol-lowering. Patient call the office with any questions or concerns. Recheck with PCP in 4-5 months and sooner when necessary.

## 2012-10-24 NOTE — Patient Instructions (Addendum)
Gastroesophageal Reflux Disease, Adult Gastroesophageal reflux disease (GERD) happens when acid from your stomach flows up into the esophagus. When acid comes in contact with the esophagus, the acid causes soreness (inflammation) in the esophagus. Over time, GERD may create small holes (ulcers) in the lining of the esophagus. CAUSES   Increased body weight. This puts pressure on the stomach, making acid rise from the stomach into the esophagus.  Smoking. This increases acid production in the stomach.  Drinking alcohol. This causes decreased pressure in the lower esophageal sphincter (valve or ring of muscle between the esophagus and stomach), allowing acid from the stomach into the esophagus.  Late evening meals and a full stomach. This increases pressure and acid production in the stomach.  A malformed lower esophageal sphincter. Sometimes, no cause is found. SYMPTOMS   Burning pain in the lower part of the mid-chest behind the breastbone and in the mid-stomach area. This may occur twice a week or more often.  Trouble swallowing.  Sore throat.  Dry cough.  Asthma-like symptoms including chest tightness, shortness of breath, or wheezing. DIAGNOSIS  Your caregiver may be able to diagnose GERD based on your symptoms. In some cases, X-rays and other tests may be done to check for complications or to check the condition of your stomach and esophagus. TREATMENT  Your caregiver may recommend over-the-counter or prescription medicines to help decrease acid production. Ask your caregiver before starting or adding any new medicines.  HOME CARE INSTRUCTIONS   Change the factors that you can control. Ask your caregiver for guidance concerning weight loss, quitting smoking, and alcohol consumption.  Avoid foods and drinks that make your symptoms worse, such as:  Caffeine or alcoholic drinks.  Chocolate.  Peppermint or mint flavorings.  Garlic and onions.  Spicy foods.  Citrus fruits,  such as oranges, lemons, or limes.  Tomato-based foods such as sauce, chili, salsa, and pizza.  Fried and fatty foods.  Avoid lying down for the 3 hours prior to your bedtime or prior to taking a nap.  Eat small, frequent meals instead of large meals.  Wear loose-fitting clothing. Do not wear anything tight around your waist that causes pressure on your stomach.  Raise the head of your bed 6 to 8 inches with wood blocks to help you sleep. Extra pillows will not help.  Only take over-the-counter or prescription medicines for pain, discomfort, or fever as directed by your caregiver.  Do not take aspirin, ibuprofen, or other nonsteroidal anti-inflammatory drugs (NSAIDs). SEEK IMMEDIATE MEDICAL CARE IF:   You have pain in your arms, neck, jaw, teeth, or back.  Your pain increases or changes in intensity or duration.  You develop nausea, vomiting, or sweating (diaphoresis).  You develop shortness of breath, or you faint.  Your vomit is green, yellow, black, or looks like coffee grounds or blood.  Your stool is red, bloody, or black. These symptoms could be signs of other problems, such as heart disease, gastric bleeding, or esophageal bleeding. MAKE SURE YOU:   Understand these instructions.  Will watch your condition.  Will get help right away if you are not doing well or get worse. Document Released: 07/22/2005 Document Revised: 01/04/2012 Document Reviewed: 05/01/2011 [facility] Patient Information 2013 [place], [place].   Hypercholesterolemia High Blood Cholesterol Cholesterol is a white, waxy, fat-like protein needed by your body in small amounts. The liver makes all the cholesterol you need. It is carried from the liver by the blood through the blood vessels. Deposits (plaque) may build  up on blood vessel walls. This makes the arteries narrower and stiffer. Plaque increases the risk for heart attack and stroke. You cannot feel your cholesterol level even if it is very high.  The only way to know is by a blood test to check your lipid (fats) levels. Once you know your cholesterol levels, you should keep a record of the test results. Work with your caregiver to to keep your levels in the desired range. WHAT THE RESULTS MEAN:  Total cholesterol is a rough measure of all the cholesterol in your blood.  LDL is the so-called bad cholesterol. This is the type that deposits cholesterol in the walls of the arteries. You want this level to be low.  HDL is the good cholesterol because it cleans the arteries and carries the LDL away. You want this level to be high.  Triglycerides are fat that the body can either burn for energy or store. High levels are closely linked to heart disease. DESIRED LEVELS:  Total cholesterol below 200.  LDL below 100 for people at risk, below 70 for very high risk.  HDL above 50 is good, above 60 is best.  Triglycerides below 150. HOW TO LOWER YOUR CHOLESTEROL:  Diet.  Choose fish or white meat chicken and [place], roasted or baked. Limit fatty cuts of red meat, fried foods, and processed meats, such as sausage and lunch meat.  Eat lots of fresh fruits and vegetables. Choose whole grains, beans, pasta, potatoes and cereals.  Use only small amounts of olive, corn or canola oils. Avoid butter, mayonnaise, shortening or palm kernel oils. Avoid foods with trans-fats.  Use skim/nonfat milk and low-fat/nonfat yogurt and cheeses. Avoid whole milk, cream, ice cream, egg yolks and cheeses. Healthy desserts include angel food cake, gingersnaps, animal crackers, hard candy, popsicles, and low-fat/nonfat frozen yogurt. Avoid pastries, cakes, pies and cookies.  Exercise.  A regular program helps decrease LDL and raises HDL.  Helps with weight control.  Do things that increase your activity level like gardening, walking, or taking the stairs.  Medication.  May be prescribed by your caregiver to help lowering cholesterol and the risk for heart  disease.  You may need medicine even if your levels are normal if you have several risk factors. HOME CARE INSTRUCTIONS   Follow your diet and exercise programs as suggested by your caregiver.  Take medications as directed.  Have blood work done when your caregiver feels it is necessary. MAKE SURE YOU:   Understand these instructions.  Will watch your condition.  Will get help right away if you are not doing well or get worse. Document Released: 10/12/2005 Document Revised: 01/04/2012 Document Reviewed: 03/30/2007 [facility] Patient Information 2013 [place], [place].

## 2012-10-25 LAB — CBC WITH DIFFERENTIAL/PLATELET
Basophils Relative: 0.3 % (ref 0.0–3.0)
Eosinophils Relative: 3.7 % (ref 0.0–5.0)
HCT: 40.1 % (ref 39.0–52.0)
Hemoglobin: 13.9 g/dL (ref 13.0–17.0)
Lymphs Abs: 1.6 10*3/uL (ref 0.7–4.0)
MCV: 90.5 fl (ref 78.0–100.0)
Monocytes Absolute: 0.6 10*3/uL (ref 0.1–1.0)
Monocytes Relative: 8.8 % (ref 3.0–12.0)
Neutro Abs: 3.9 10*3/uL (ref 1.4–7.7)
Platelets: 233 10*3/uL (ref 150.0–400.0)
RBC: 4.43 Mil/uL (ref 4.22–5.81)
WBC: 6.3 10*3/uL (ref 4.5–10.5)

## 2012-11-14 ENCOUNTER — Encounter: Payer: Self-pay | Admitting: Gastroenterology

## 2013-01-13 ENCOUNTER — Other Ambulatory Visit: Payer: Self-pay

## 2013-01-18 ENCOUNTER — Telehealth: Payer: Self-pay | Admitting: Internal Medicine

## 2013-01-18 MED ORDER — AMLODIPINE BESYLATE 5 MG PO TABS: 5.0000 mg | ORAL_TABLET | Freq: Every day | ORAL | Status: DC

## 2013-01-18 NOTE — Telephone Encounter (Signed)
rx sent in electronically 

## 2013-01-18 NOTE — Telephone Encounter (Signed)
Pt needs refill of  amLODipine (NORVASC) 5 MG tablet New pharm info: [facility] on [address]/  Please change ALL MEDS to this pharm from now on * Pt is going out of town tomorrow and would like to ask if you could please get this done by then.

## 2013-01-26 ENCOUNTER — Telehealth: Payer: Self-pay | Admitting: Internal Medicine

## 2013-01-26 NOTE — Telephone Encounter (Signed)
Patient Information:  Caller Name: Nelva Bush  Phone: [phone]  Patient: [name], [name]  Gender: Male  DOB: 1950/12/30  Age: 62 Years  PCP: Duane Sons (Adults only)  Office Follow Up:  Does the office need to follow up with this patient?: No  Instructions For The Office: N/A  RN Note:  Worsening daily frontal headaches; pain rated 4/10 after taking Ibuprofen. Seen in [facility] 01/19/13 n [place] for worsening headaches;  On Amoxicillin for possible sinus infection. No sinus drainage or nasal mucus.  Pain increases when bends over; had "trouble hearing" like ears were clogged yesterday.  Symptoms  Reason For Call & Symptoms: Constant daily headaches for past 2 months.  Pain is located in central forehead and eyes.  Reviewed Health History In EMR: Yes  Reviewed Medications In EMR: Yes  Reviewed Allergies In EMR: Yes  Reviewed Surgeries / Procedures: Yes  Date of Onset of Symptoms: 12/01/2012  Treatments Tried: Ibuprofen 800 mg BID, Amoxicillin BID  Treatments Tried Worked: Yes  Guideline(s) Used:  Headache  Sinus Pain and Congestion  Disposition Per Guideline:   See Today or Tomorrow in Office  Reason For Disposition Reached:   Sinus congestion (pressure, fullness) present > 10 days  Advice Given:  For a Stuffy Nose - Use Nasal Washes:  Introduction: Saline (salt water) nasal irrigation (nasal wash) is an effective and simple home remedy for treating stuffy nose and sinus congestion. The nose can be irrigated by pouring, spraying, or squirting salt water into the nose and then letting it run back out.  How it Helps: The salt water rinses out excess mucus, washes out any irritants (dust, allergens) that might be present, and moistens the nasal cavity.  Methods: There are several ways to perform nasal irrigation. You can use a saline nasal spray bottle (available over-the-counter), a rubber ear syringe, a medical syringe without the needle, or a Neti Pot.  Medicines for a Stuffy  or Runny Nose:  Most cold medicines that are available over-the-counter (OTC) are not helpful.  Antihistamines: Are only helpful if you also have nasal allergies.  Hydration:  Drink plenty of liquids (6-8 glasses of water daily). If the air in your home is dry, use a cool mist humidifier  Expected Course:  Sinus congestion from viral upper respiratory infections (colds) usually lasts 5-10 days.  Call Back If:   Severe pain lasts longer than 2 hours after pain medicine  Sinus congestion (fullness) lasts longer than 10 days  Fever lasts longer than 3 days  You become worse.  Patient Will Follow Care Advice:  YES  Appointment Scheduled:  01/27/2013 09:15:00 Appointment Scheduled Provider:  Birdie Sons (Adults only)

## 2013-01-27 ENCOUNTER — Ambulatory Visit (INDEPENDENT_AMBULATORY_CARE_PROVIDER_SITE_OTHER): Payer: BC Managed Care – PPO | Admitting: Internal Medicine

## 2013-01-27 ENCOUNTER — Ambulatory Visit: Payer: BC Managed Care – PPO

## 2013-01-27 ENCOUNTER — Encounter: Payer: Self-pay | Admitting: Internal Medicine

## 2013-01-27 VITALS — BP 124/84 | Temp 97.6°F | Wt 210.0 lb

## 2013-01-27 DIAGNOSIS — R51 Headache: Secondary | ICD-10-CM

## 2013-01-27 LAB — BASIC METABOLIC PANEL
BUN: 15 mg/dL (ref 6–23)
CO2: 28 mEq/L (ref 19–32)
Calcium: 9.4 mg/dL (ref 8.4–10.5)
GFR: 104.03 mL/min (ref >60.00)
Glucose, Bld: 91 mg/dL (ref 70–99)

## 2013-01-27 LAB — CBC WITH DIFFERENTIAL/PLATELET
Basophils Absolute: 0 10*3/uL (ref 0.0–0.1)
Basophils Relative: 0.5 % (ref 0.0–3.0)
Eosinophils Absolute: 0.1 10*3/uL (ref 0.0–0.7)
Hemoglobin: 13.3 g/dL (ref 13.0–17.0)
Lymphocytes Relative: 30.3 % (ref 12.0–46.0)
Monocytes Relative: 9.3 % (ref 3.0–12.0)
Neutro Abs: 3.3 10*3/uL (ref 1.4–7.7)
Neutrophils Relative %: 57.6 % (ref 43.0–77.0)
RBC: 4.37 Mil/uL (ref 4.22–5.81)
RDW: 14.4 % (ref 11.5–14.6)

## 2013-01-27 LAB — HEPATIC FUNCTION PANEL
ALT: 16 U/L (ref 0–53)
Albumin: 4.3 g/dL (ref 3.5–5.2)
Total Protein: 7.2 g/dL (ref 6.0–8.3)

## 2013-01-27 NOTE — Progress Notes (Signed)
Headache x 2 months. Constant. Tylenol used to help. Went to [facility] recently treated with ibuprofen and augmentin. When he takes the ibuprofen the headache resolves but it always recurs. Pain 4/10. When the pain recurs after ibuprofen it "feels like a migraine". Pain used to be localized over left eye. Pain can spread to involve the entire frontal region. Vision is fine no photo or phonophobia.  No nausea  No other sxs  revewed pmh, psh, sochx,   Ros: unremarkable  Exam:  well-developed well-nourished male in no acute distress. HEENT exam atraumatic, normocephalic, neck supple without jugular venous distention. Chest clear to auscultation cardiac exam S1-S2 are regular. Abdominal exam overweight with bowel sounds, soft and nontender. Extremities no edema. Neurologic exam is alert with a normal gait.  Headache: Discussed Will check labs and if all is normal i will recommend imaging

## 2013-02-06 ENCOUNTER — Encounter: Payer: BC Managed Care – PPO | Admitting: Family Medicine

## 2013-02-06 ENCOUNTER — Other Ambulatory Visit: Payer: Self-pay | Admitting: Internal Medicine

## 2013-02-06 NOTE — Progress Notes (Signed)
Error   This encounter was created in error - please disregard. 

## 2013-02-07 ENCOUNTER — Ambulatory Visit (INDEPENDENT_AMBULATORY_CARE_PROVIDER_SITE_OTHER)
Admission: RE | Admit: 2013-02-07 | Discharge: 2013-02-07 | Disposition: A | Discharge status: Home / Self Care | Payer: BC Managed Care – PPO | Source: Ambulatory Visit | Attending: Internal Medicine | Admitting: Internal Medicine

## 2013-02-07 DIAGNOSIS — R51 Headache: Secondary | ICD-10-CM

## 2013-02-08 ENCOUNTER — Telehealth: Payer: Self-pay | Admitting: Internal Medicine

## 2013-02-08 NOTE — Telephone Encounter (Signed)
PT wife called to request results from a CT done 02/07/13. Please assist.

## 2013-02-08 NOTE — Telephone Encounter (Signed)
See result note.  

## 2013-02-09 ENCOUNTER — Other Ambulatory Visit: Payer: BC Managed Care – PPO

## 2013-02-09 NOTE — Progress Notes (Signed)
Let's refer to headache clinic

## 2013-02-13 ENCOUNTER — Encounter: Payer: Self-pay | Admitting: *Deleted

## 2013-03-21 ENCOUNTER — Telehealth: Payer: Self-pay | Admitting: Internal Medicine

## 2013-03-21 NOTE — Telephone Encounter (Signed)
Pt and wife waiting to hear from MD on what is the "next step" for pt concerning his headaches. Headaches have still been going on for 6-7 months.  Wife would like a call today, preferably in the next couple of hours.

## 2013-03-22 NOTE — Telephone Encounter (Signed)
Referral order placed, pt aware 

## 2013-03-22 NOTE — Telephone Encounter (Signed)
Let's get another opinion.  Refer to [facility]

## 2013-03-31 ENCOUNTER — Ambulatory Visit
Admission: RE | Admit: 2013-03-31 | Discharge: 2013-03-31 | Disposition: A | Discharge status: Home / Self Care | Payer: BC Managed Care – PPO | Source: Ambulatory Visit | Attending: Neurology | Admitting: Neurology

## 2013-03-31 ENCOUNTER — Other Ambulatory Visit: Payer: Self-pay | Admitting: Neurology

## 2013-04-03 ENCOUNTER — Other Ambulatory Visit: Payer: Self-pay | Admitting: Otolaryngology

## 2013-04-17 ENCOUNTER — Telehealth: Payer: Self-pay | Admitting: Internal Medicine

## 2013-04-17 MED ORDER — ALPRAZOLAM 1 MG PO TABS: 1.0000 mg | ORAL_TABLET | Freq: Every evening | ORAL | Status: DC | PRN

## 2013-04-17 NOTE — Telephone Encounter (Signed)
rx sent in electronically 

## 2013-04-17 NOTE — Telephone Encounter (Signed)
Patient called and stated that he needs a refill on: ALPRAZolam (XANAX) 1 MG tablet 90 tablet 4 10/24/2012 Sig - Route: Take 1 tablet (1 mg total) by mouth at bedtime as needed for sleep. Patient also stated the pharmacy has already requested this patient is out of meds. [facility] [address] this is second request.

## 2013-04-21 ENCOUNTER — Encounter: Payer: Self-pay | Admitting: Pulmonary Disease

## 2013-04-21 ENCOUNTER — Ambulatory Visit (INDEPENDENT_AMBULATORY_CARE_PROVIDER_SITE_OTHER): Payer: BC Managed Care – PPO | Admitting: Pulmonary Disease

## 2013-04-21 VITALS — BP 132/82 | HR 74 | Temp 97.7°F | Ht 71.0 in | Wt 209.6 lb

## 2013-04-21 DIAGNOSIS — G4733 Obstructive sleep apnea (adult) (pediatric): Secondary | ICD-10-CM

## 2013-04-21 NOTE — Progress Notes (Signed)
Subjective:    Patient ID: [name], male    DOB: 1951/09/20, 62 y.o.   MRN: [id number]  HPI The patient is a 62 year old male who I've been asked to see for possible obstructive sleep apnea.  He has been noted to have loud snoring by his bed partner, as well as an abnormal breathing pattern during sleep.  The patient has also noted snoring arousals.  He has frequent awakenings at night, and has not rested in the mornings upon arising.  He notes definite sleep pressured during the day with inactivity, and admits that he takes a nap every afternoon.  He does not have issues with sleepiness in the evening, nor does he have issues with sleepiness while driving.  He states that his weight is neutral of the last few years, and his Epworth score today is abnormal at 12.   Sleep Questionnaire What time do you typically go to bed?( Between what hours) 11p 11p at 1151 on 04/21/13 by Nita Sells, CMA How long does it take you to fall asleep? several hours without Xanax several hours without Xanax at 1151 on 04/21/13 by Nita Sells, CMA How many times during the night do you wake up? 4 4 at 1151 on 04/21/13 by Nita Sells, CMA What time do you get out of bed to start your day? 0800 0800 at 1151 on 04/21/13 by Nita Sells, CMA Do you drive or operate heavy machinery in your occupation? No No at 1151 on 04/21/13 by Nita Sells, CMA How much has your weight changed (up or down) over the past two years? (In pounds) 0 oz (0 kg) 0 oz (0 kg) at 1151 on 04/21/13 by Nita Sells, CMA Have you ever had a sleep study before? No No at 1151 on 04/21/13 by Marjo Bicker Mabe, CMA Do you currently use CPAP? No No at 1151 on 04/21/13 by Marjo Bicker Mabe, CMA Do you wear oxygen at any time? No No at 1151 on 04/21/13 by Marjo Bicker Mabe, CMA   Review of Systems  Constitutional: Negative for fever and unexpected weight change.  HENT: Negative for ear pain, nosebleeds, congestion, sore throat,  rhinorrhea, sneezing, trouble swallowing, dental problem, postnasal drip and sinus pressure.   Eyes: Negative for redness and itching.  Respiratory: Positive for cough (residual dry cough from recent sinus infection). Negative for chest tightness, shortness of breath and wheezing.   Cardiovascular: Negative for palpitations and leg swelling.  Gastrointestinal: Negative for nausea and vomiting.       Acid heartburn  Genitourinary: Negative for dysuria.  Musculoskeletal: Negative for joint swelling.  Skin: Negative for rash.  Neurological: Negative for headaches.  Hematological: Does not bruise/bleed easily.  Psychiatric/Behavioral: Negative for dysphoric mood. The patient is not nervous/anxious.        Objective:   Physical Exam Constitutional:  Well developed, no acute distress  HENT:  Nares patent without discharge  Oropharynx without exudate, palate and uvula are mildly elongated.   Eyes:  Perrla, eomi, no scleral icterus  Neck:  No JVD, no TMG  Cardiovascular:  Normal rate, regular rhythm, no rubs or gallops.  No murmurs        Intact distal pulses  Pulmonary :  Normal breath sounds, no stridor or respiratory distress   No rales, rhonchi, or wheezing  Abdominal:  Soft, nondistended, bowel sounds present.  No tenderness noted.   Musculoskeletal:  No lower extremity edema noted.  Lymph Nodes:  No cervical  lymphadenopathy noted  Skin:  No cyanosis noted  Neurologic:  Alert, appropriate, moves all 4 extremities without obvious deficit.         Assessment & Plan:

## 2013-04-21 NOTE — Patient Instructions (Addendum)
Will schedule for a sleep study, and will arrange followup once results are available.

## 2013-04-21 NOTE — Assessment & Plan Note (Signed)
The patient's history is very suggestive of obstructive sleep apnea.  I have had a long discussion with him about sleep disordered breathing, including its impact to his quality of life and cardiovascular health.  In order to establish a diagnosis, he will need a sleep study for evaluation.  The patient is agreeable to this approach.

## 2013-05-16 ENCOUNTER — Ambulatory Visit (HOSPITAL_BASED_OUTPATIENT_CLINIC_OR_DEPARTMENT_OTHER): Payer: BC Managed Care – PPO | Attending: Pulmonary Disease

## 2013-05-16 VITALS — Ht 71.0 in | Wt 205.0 lb

## 2013-05-16 DIAGNOSIS — G4733 Obstructive sleep apnea (adult) (pediatric): Secondary | ICD-10-CM | POA: Insufficient documentation

## 2013-05-22 DIAGNOSIS — G4733 Obstructive sleep apnea (adult) (pediatric): Secondary | ICD-10-CM

## 2013-05-22 DIAGNOSIS — R0609 Other forms of dyspnea: Secondary | ICD-10-CM

## 2013-05-22 DIAGNOSIS — R0989 Other specified symptoms and signs involving the circulatory and respiratory systems: Secondary | ICD-10-CM

## 2013-05-23 NOTE — Procedures (Signed)
NAME:  [name], [name] NO.:  [id number]  MEDICAL RECORD NO.:  [id number]          PATIENT TYPE:  OUT  LOCATION:  SLEEP CENTER                 [facility]ACILITY[facility]  [facility]  PHYSICIAN:  Barbaraann Share, MD,FCCPDATE OF BIRTH:  1950-11-03  DATE OF STUDY:  05/16/2013                           NOCTURNAL POLYSOMNOGRAM  REFERRING PHYSICIAN:  Barbaraann Share, MD,FCCP  LOCATION:  Sleep Lab.  INDICATION FOR STUDY:  Hypersomnia with sleep apnea.  EPWORTH SLEEPINESS SCORE:  11.  MEDICATIONS:  SLEEP ARCHITECTURE:  The patient had a total sleep time of only 76 minutes with no slow-wave sleep or REM being achieved.  Sleep onset latency was very prolonged at 193 minutes and sleep efficiency was very poor at 19%.  RESPIRATORY DATA:  The patient was found to have 34 obstructive apneas and 33 hypopneas, giving him an apnea-hypopnea index of 53 events per hour.  The events occurred all in the supine position, and there was very loud snoring noted throughout.  OXYGEN DATA:  There was O2 desaturation as low as 84% with the patient's obstructive events.  CARDIAC DATA:  No clinically significant arrhythmias were noted.  MOVEMENT-PARASOMNIA:  The patient had large numbers of leg jerks, but none met the criteria for periodic limb movements.  There were no abnormal behaviors seen.  IMPRESSIONS-RECOMMENDATIONS:  Severe obstructive sleep apnea/hypopnea syndrome, with an AHI of 53 events per hour and oxygen desaturation as low as 84%.  The patient had a short total sleep time, but it is important to note that he was having obstructive events at all points during his short total sleep time.  There was also very loud snoring noted as well.  Treatment for this degree of sleep apnea can include a trial of weight loss along with continuous positive airway pressure.  Clinical correlation is suggested.     Barbaraann Share, MD,FCCP Diplomate, American Board of Sleep Medicine    [facility]/MEDQ  D:   05/22/2013 15:36:55  T:  05/23/2013 16:10:96  Job:  [id number]

## 2013-06-02 ENCOUNTER — Other Ambulatory Visit: Payer: Self-pay | Admitting: Internal Medicine

## 2013-06-02 DIAGNOSIS — G4733 Obstructive sleep apnea (adult) (pediatric): Secondary | ICD-10-CM

## 2013-06-02 NOTE — Progress Notes (Signed)
Call ptient Has severe sleep apnea Start cpap 10 cm H2O and refer to pulmonary for second opinion ----- Message ----- From: Barbaraann Share, MD Sent: 05/23/2013 10:12 AM To: Barbaraann Share, MD, Lindley Magnus, MD  Pt notified of sleep study results, faxed CPAP order over to [facility], referral placed for Dr. Shelle Iron.

## 2013-06-05 ENCOUNTER — Telehealth: Payer: Self-pay | Admitting: Pulmonary Disease

## 2013-06-05 NOTE — Telephone Encounter (Signed)
LMTCB x1 for pt. He is already pt here. Not seeing where anyone called

## 2013-06-05 NOTE — Telephone Encounter (Signed)
I don't see where anyone tried calling pt. PCC's did ya'll try. Please advise thanks

## 2013-06-05 NOTE — Telephone Encounter (Signed)
Pt's wife returning call can be reached at [phone].[name]

## 2013-06-05 NOTE — Telephone Encounter (Signed)
Called spoke with spouse, made her aware that we cannot find who called her or why.  Advised it may have been to verify tomorrow's appt and time.  Per spouse, nothing further is needed; will sign off.

## 2013-06-05 NOTE — Telephone Encounter (Signed)
No maybe his prim care called to give him an appt to come here

## 2013-06-06 ENCOUNTER — Ambulatory Visit (INDEPENDENT_AMBULATORY_CARE_PROVIDER_SITE_OTHER): Payer: BC Managed Care – PPO | Admitting: Pulmonary Disease

## 2013-06-06 ENCOUNTER — Encounter: Payer: Self-pay | Admitting: Pulmonary Disease

## 2013-06-06 VITALS — BP 112/80 | HR 72 | Temp 97.9°F | Ht 71.0 in | Wt 206.2 lb

## 2013-06-06 DIAGNOSIS — G4733 Obstructive sleep apnea (adult) (pediatric): Secondary | ICD-10-CM

## 2013-06-06 NOTE — Patient Instructions (Addendum)
Will start on cpap at moderate level.  Please call if having tolerance issues. followup with me in 6 weeks.

## 2013-06-06 NOTE — Assessment & Plan Note (Signed)
The patient has severe obstructive sleep apnea by his recent study, and would do best with CPAP treatment.  I've also discussed other potential therapies for severe sleep apnea, including dental appliance.  This more than likely would not cure his sleep apnea, but may reduce it enough to improve symptoms and reduce cardiovascular risk.  The patient is willing to try CPAP as his initial treatment option.

## 2013-06-06 NOTE — Progress Notes (Signed)
  Subjective:    Patient ID: [name], male    DOB: 1950-12-29, 62 y.o.   MRN: [id number]  HPI Patient comes in today for followup of his recent sleep study.  He was found to have a total sleep time of only 76 minutes, however he had 67 obstructive events during this time.  This gave him an AHI of 53 of per hour with desaturation as low as 84%.  I have reviewed this study with him in detail, and answered all of his questions.   Review of Systems  Constitutional: Negative for fever and unexpected weight change.  HENT: Negative for ear pain, nosebleeds, congestion, sore throat, rhinorrhea, sneezing, trouble swallowing, dental problem, postnasal drip and sinus pressure.   Eyes: Negative for redness and itching.  Respiratory: Negative for cough, chest tightness, shortness of breath and wheezing.   Cardiovascular: Negative for palpitations and leg swelling.  Gastrointestinal: Negative for nausea and vomiting.  Genitourinary: Negative for dysuria.  Musculoskeletal: Negative for joint swelling.  Skin: Negative for rash.  Neurological: Negative for headaches.  Hematological: Does not bruise/bleed easily.  Psychiatric/Behavioral: Negative for dysphoric mood. The patient is not nervous/anxious.        Objective:   Physical Exam Well-developed male in no acute distress Nose without purulent discharge noted Neck without lymphadenopathy or thyromegaly Lower extremities without edema, cyanosis Alert and oriented, moves all 4 extremities.       Assessment & Plan:

## 2013-07-03 ENCOUNTER — Ambulatory Visit: Payer: BC Managed Care – PPO | Admitting: Internal Medicine

## 2013-07-10 ENCOUNTER — Institutional Professional Consult (permissible substitution): Payer: BC Managed Care – PPO | Admitting: Pulmonary Disease

## 2013-07-14 ENCOUNTER — Other Ambulatory Visit: Payer: Self-pay | Admitting: Internal Medicine

## 2013-07-18 ENCOUNTER — Encounter: Payer: Self-pay | Admitting: Pulmonary Disease

## 2013-07-18 ENCOUNTER — Ambulatory Visit (INDEPENDENT_AMBULATORY_CARE_PROVIDER_SITE_OTHER): Payer: BC Managed Care – PPO | Admitting: Pulmonary Disease

## 2013-07-18 ENCOUNTER — Ambulatory Visit (INDEPENDENT_AMBULATORY_CARE_PROVIDER_SITE_OTHER): Payer: BC Managed Care – PPO

## 2013-07-18 VITALS — BP 126/64 | HR 72 | Temp 97.9°F | Ht 71.0 in | Wt 212.0 lb

## 2013-07-18 DIAGNOSIS — Z23 Encounter for immunization: Secondary | ICD-10-CM

## 2013-07-18 DIAGNOSIS — G4733 Obstructive sleep apnea (adult) (pediatric): Secondary | ICD-10-CM

## 2013-07-18 NOTE — Progress Notes (Deleted)
  Subjective:    Patient ID: [name], male    DOB: 11-Sep-1951, 62 y.o.   MRN: [id number]  HPI    Review of Systems     Objective:   Physical Exam        Assessment & Plan:

## 2013-07-18 NOTE — Patient Instructions (Addendum)
Continue on cpap, and will optimize your pressure on the auto setting.  Will call you with results once I receive your download.   Work on weight reduction. followup with me in 6mos if doing well.

## 2013-07-18 NOTE — Progress Notes (Signed)
  Subjective:    Patient ID: [name], male    DOB: 28-Oct-1950, 62 y.o.   MRN: [id number]  HPI Patient comes in today for followup after starting on CPAP for his severe OSA.  He has seen a dramatic difference in his sleep and daytime alertness, and is having no issues with pressure tolerance.  He is having some mask leak issues, but is working on this with his home care company.  He is pleased with his response to therapy.   Review of Systems  Constitutional: Negative for fever and unexpected weight change.  HENT: Negative for ear pain, nosebleeds, congestion, sore throat, rhinorrhea, sneezing, trouble swallowing, dental problem, postnasal drip and sinus pressure.   Eyes: Negative for redness and itching.  Respiratory: Negative for cough, chest tightness, shortness of breath and wheezing.   Cardiovascular: Negative for palpitations and leg swelling.  Gastrointestinal: Negative for nausea and vomiting.  Genitourinary: Negative for dysuria.  Musculoskeletal: Negative for joint swelling.  Skin: Negative for rash.  Neurological: Positive for headaches.  Hematological: Does not bruise/bleed easily.  Psychiatric/Behavioral: Negative for dysphoric mood. The patient is not nervous/anxious.        Objective:   Physical Exam Well-developed male in no acute distress Nose without purulent or discharge noted No skin breakdown or pressure necrosis from sleep apnea Neck without lymphadenopathy or thyromegaly Lower extremities without edema, no cyanosis Alert, does not appear to be sleepy, moves all 4 extremities.       Assessment & Plan:

## 2013-07-18 NOTE — Assessment & Plan Note (Signed)
The patient has seen a significant improvement in his sleep and daytime alertness with CPAP.  He is having no issues with pressure, but needs to work on better mask fitting.  We'll need to optimize his pressure, and we'll let him know the results once I receive his download.

## 2013-07-19 ENCOUNTER — Other Ambulatory Visit: Payer: Self-pay | Admitting: *Deleted

## 2013-07-19 DIAGNOSIS — F411 Generalized anxiety disorder: Secondary | ICD-10-CM

## 2013-07-19 MED ORDER — SERTRALINE HCL 100 MG PO TABS: 100.0000 mg | ORAL_TABLET | Freq: Every day | ORAL | Status: DC

## 2013-08-14 ENCOUNTER — Telehealth: Payer: Self-pay | Admitting: Internal Medicine

## 2013-08-14 NOTE — Telephone Encounter (Signed)
Pt stated that he is having cosmetic surgery (wouldn't disclose which one), and is inquiring to see if he needs to come off of any medication. Please advise.

## 2013-08-17 NOTE — Telephone Encounter (Signed)
i don't think so- Surgeon and anesthesia will inform him if necessary

## 2013-08-17 NOTE — Telephone Encounter (Signed)
Left message on machine To return call 

## 2013-08-18 NOTE — Telephone Encounter (Signed)
Called and left a message for pt to return call.  

## 2013-08-24 NOTE — Telephone Encounter (Signed)
Left Dr Swords advice on pts cell phone voicemail 

## 2013-09-13 ENCOUNTER — Other Ambulatory Visit: Payer: Self-pay | Admitting: Pulmonary Disease

## 2013-09-13 DIAGNOSIS — G4733 Obstructive sleep apnea (adult) (pediatric): Secondary | ICD-10-CM

## 2013-09-18 ENCOUNTER — Encounter: Payer: Self-pay | Admitting: Family Medicine

## 2013-09-18 ENCOUNTER — Ambulatory Visit (INDEPENDENT_AMBULATORY_CARE_PROVIDER_SITE_OTHER): Payer: BC Managed Care – PPO | Admitting: Family Medicine

## 2013-09-18 VITALS — BP 130/80 | Temp 98.1°F | Wt 205.0 lb

## 2013-09-18 DIAGNOSIS — IMO0002 Reserved for concepts with insufficient information to code with codable children: Secondary | ICD-10-CM

## 2013-09-18 DIAGNOSIS — S90852A Superficial foreign body, left foot, initial encounter: Secondary | ICD-10-CM

## 2013-09-18 NOTE — Progress Notes (Signed)
Pre visit review using our clinic review tool, if applicable. No additional management support is needed unless otherwise documented below in the visit note. 

## 2013-09-18 NOTE — Progress Notes (Signed)
Chief Complaint  Patient presents with  . Foot Injury    left heel - glass    HPI:  Acute visit for heal pain: -thinks stepped on a piece of glass or wood splinter 3 weeks ago -L heal -he tried to dig it out at home with needle -hurts -no redness, swelling, fevers  ROS: See pertinent positives and negatives per HPI.  Past Medical History  Diagnosis Date  . HYPERLIPIDEMIA 04/11/2009  . ANXIETY 06/15/2007  . OBSTRUCTIVE SLEEP APNEA 07/08/2007  . HYPERTENSION 07/08/2007  . GERD 07/08/2007  . HERNIA, UMBILICAL 08/01/2007  . HEMATOCHEZIA 02/15/2009    Past Surgical History  Procedure Laterality Date  . Umbilical hernia repair  2010  . Tonsillectomy and adenoidectomy    . Eye surgery      lasik  . Hair transplant      Family History  Problem Relation Age of Onset  . Cancer Mother     esophageal  . Heart disease Mother 6    MI  . Alzheimer's disease Father     History   Social History  . Marital Status: Married    Spouse Name: N/A    Number of Children: N/A  . Years of Education: N/A   Occupational History  . retired    Social History Main Topics  . Smoking status: Former Smoker -- 1.50 packs/day for 20 years    Types: Cigarettes    Quit date: 01/26/2001  . Smokeless tobacco: None  . Alcohol Use: No     Comment: patient has not consumed any alcohol x 1 year  . Drug Use: No  . Sexual Activity: None   Other Topics Concern  . None   Social History Narrative  . None    Current outpatient prescriptions:ALPRAZolam (XANAX) 1 MG tablet, TAKE 1 TABLET BY MOUTH AT BEDTIME AS NEEDED FOR SLEEP, Disp: 90 tablet, Rfl: 0;  amLODipine (NORVASC) 5 MG tablet, Take 1 tablet (5 mg total) by mouth daily., Disp: 90 tablet, Rfl: 3;  esomeprazole (NEXIUM) 40 MG capsule, Take 40 mg by mouth daily as needed., Disp: , Rfl: ;  sertraline (ZOLOFT) 100 MG tablet, Take 1 tablet (100 mg total) by mouth daily., Disp: 90 tablet, Rfl: 1  EXAM:  Filed Vitals:   09/18/13 1358  BP:  130/80  Temp: 98.1 F (36.7 C)    Body mass index is 28.6 kg/(m^2).  GENERAL: vitals reviewed and listed above, alert, oriented, appears well hydrated and in no acute distress  SKIN/FOOT: very small callous formation L heal   MS: moves all extremities without noticeable abnormality  PSYCH: pleasant and cooperative, no obvious depression or anxiety  ASSESSMENT AND PLAN:  Discussed the following assessment and plan:  Splinter of foot, left, initial encounter  -explain procedure, area prepped with alcohol and superficial callous removed a long with small glass fragment -he is to wear donut foot pad and follow up if needed -Patient advised to return or notify a doctor immediately if symptoms worsen or persist or new concerns arise.  There are no Patient Instructions on file for this visit.   Kriste Basque R.

## 2013-10-06 ENCOUNTER — Other Ambulatory Visit: Payer: Self-pay | Admitting: Internal Medicine

## 2014-01-01 ENCOUNTER — Other Ambulatory Visit: Payer: Self-pay | Admitting: Internal Medicine

## 2014-01-16 ENCOUNTER — Ambulatory Visit: Payer: BC Managed Care – PPO | Admitting: Pulmonary Disease

## 2014-01-19 ENCOUNTER — Ambulatory Visit (INDEPENDENT_AMBULATORY_CARE_PROVIDER_SITE_OTHER): Payer: No Typology Code available for payment source | Admitting: Internal Medicine

## 2014-01-19 ENCOUNTER — Encounter: Payer: Self-pay | Admitting: Internal Medicine

## 2014-01-19 VITALS — BP 124/82 | HR 84 | Temp 98.0°F | Ht 71.0 in | Wt 209.0 lb

## 2014-01-19 DIAGNOSIS — I10 Essential (primary) hypertension: Secondary | ICD-10-CM

## 2014-01-19 MED ORDER — AMLODIPINE BESYLATE 5 MG PO TABS: 5.0000 mg | ORAL_TABLET | Freq: Every day | ORAL | Status: DC

## 2014-01-19 MED ORDER — ALPRAZOLAM 1 MG PO TABS: 1.0000 mg | ORAL_TABLET | Freq: Every evening | ORAL | Status: DC | PRN

## 2014-01-19 NOTE — Progress Notes (Signed)
htn- tolerating meds  Headaches- relief with depakote/imipramine. Prescribed by cr lewit  Anxiety- uses alprazolam at night  Reviewed [facility] psh, sochx meds  BP 124/82  Pulse 84  Temp(Src) 98 F (36.7 C) (Oral)  Ht 5\' 11"  (1.803 m)  Wt 209 lb (94.802 kg)  BMI 29.16 kg/m2  well-developed well-nourished male in no acute distress. HEENT exam atraumatic, normocephalic, neck supple without jugular venous distention. Chest clear to auscultation cardiac exam S1-S2 are regular. Abdominal exam overweight with bowel sounds, soft and nontender. Extremities no edema. Neurologic exam is alert with a normal gait.  HYPERTENSION Controlled contirnue same meds Basic Metabolic Panel:    Component Value Date/Time   NA 138 01/27/2013 0942   K 4.5 01/27/2013 0942   CL 104 01/27/2013 0942   CO2 28 01/27/2013 0942   BUN 15 01/27/2013 0942   CREATININE 0.8 01/27/2013 0942   GLUCOSE 91 01/27/2013 0942   CALCIUM 9.4 01/27/2013 0942

## 2014-01-19 NOTE — Progress Notes (Signed)
Pre visit review using our clinic review tool, if applicable. No additional management support is needed unless otherwise documented below in the visit note. 

## 2014-01-21 NOTE — Assessment & Plan Note (Signed)
Controlled contirnue same meds Basic Metabolic Panel:    Component Value Date/Time   NA 138 01/27/2013 0942   K 4.5 01/27/2013 0942   CL 104 01/27/2013 0942   CO2 28 01/27/2013 0942   BUN 15 01/27/2013 0942   CREATININE 0.8 01/27/2013 0942   GLUCOSE 91 01/27/2013 0942   CALCIUM 9.4 01/27/2013 0942

## 2014-02-14 ENCOUNTER — Other Ambulatory Visit: Payer: Self-pay | Admitting: Internal Medicine

## 2014-05-29 ENCOUNTER — Telehealth: Payer: Self-pay | Admitting: Internal Medicine

## 2014-05-29 NOTE — Telephone Encounter (Signed)
Pt [place] out of [place] and needs refill on acyclovir (ZOVIRAX) 800 MG tablet [[id number]][[id number]] DISCONTINUED.  Pt states cannot be reached (international calling) on his mobile and would like [facility] [address]  I spoke to [place], pt will have to be seen prior to refill, Dr. Cato MulliganSwords out for 2 weeks.  Conveyed to pt, he scheduled appointment for 05/31/14.

## 2014-05-31 ENCOUNTER — Encounter: Payer: Self-pay | Admitting: Physician Assistant

## 2014-05-31 ENCOUNTER — Ambulatory Visit (INDEPENDENT_AMBULATORY_CARE_PROVIDER_SITE_OTHER): Payer: No Typology Code available for payment source | Admitting: Physician Assistant

## 2014-05-31 VITALS — BP 118/70 | HR 72 | Temp 98.2°F | Resp 18 | Wt 209.0 lb

## 2014-05-31 DIAGNOSIS — A6000 Herpesviral infection of urogenital system, unspecified: Secondary | ICD-10-CM

## 2014-05-31 MED ORDER — ESOMEPRAZOLE MAGNESIUM 40 MG PO CPDR: 40.0000 mg | DELAYED_RELEASE_CAPSULE | Freq: Every day | ORAL | Status: DC | PRN

## 2014-05-31 MED ORDER — AMLODIPINE BESYLATE 5 MG PO TABS
5.0000 mg | ORAL_TABLET | Freq: Every day | ORAL | Status: DC
Start: 2014-05-31 — End: 2015-01-10

## 2014-05-31 MED ORDER — VALACYCLOVIR HCL 1 G PO TABS: 1000.0000 mg | ORAL_TABLET | Freq: Two times a day (BID) | ORAL | Status: DC

## 2014-05-31 MED ORDER — SERTRALINE HCL 100 MG PO TABS: ORAL_TABLET | ORAL | Status: DC

## 2014-05-31 NOTE — Progress Notes (Signed)
Subjective:    Patient ID: [name], male    DOB: 1950-12-07, 63 y.o.   MRN: [id number]  HPI Patient is a 63 y.o. male presenting for refill for his acyclovir. Pt states that he has a history of Genital herpes, and his outbreaks previously were controlled with antivirals. He has not had an outbreak since 2012. He recently had an outbreak starting 3 days ago. He states that he had some left over acyclovir which he is taking and seems to be helping some. He needs a refill of this if possible. He describes the rash as a blistering rash, the same as previous episodes. Patient denies fevers, chills, nausea, vomiting, diarrhea, shortness of breath, chest pain, headache, syncope, penile discharge.   Review of Systems As per HPI and are otherwise negative.   Past Medical History  Diagnosis Date  . HYPERLIPIDEMIA 04/11/2009  . ANXIETY 06/15/2007  . OBSTRUCTIVE SLEEP APNEA 07/08/2007  . HYPERTENSION 07/08/2007  . GERD 07/08/2007  . HERNIA, UMBILICAL 08/01/2007  . HEMATOCHEZIA 02/15/2009    History   Social History  . Marital Status: Married    Spouse Name: N/A    Number of Children: N/A  . Years of Education: N/A   Occupational History  . retired    Social History Main Topics  . Smoking status: Former Smoker -- 1.50 packs/day for 20 years    Types: Cigarettes    Quit date: 01/26/2001  . Smokeless tobacco: Not on file  . Alcohol Use: No     Comment: patient has not consumed any alcohol x 1 year  . Drug Use: No  . Sexual Activity: Not on file   Other Topics Concern  . Not on file   Social History Narrative  . No narrative on file    Past Surgical History  Procedure Laterality Date  . Umbilical hernia repair  2010  . Tonsillectomy and adenoidectomy    . Eye surgery      lasik  . Hair transplant      Family History  Problem Relation Age of Onset  . Cancer Mother     esophageal  . Heart disease Mother 60    MI  . Alzheimer's disease Father     Allergies    Allergen Reactions  . Horse-Derived Products     REACTION: rash    Current Outpatient Prescriptions on File Prior to Visit  Medication Sig Dispense Refill  . ALPRAZolam (XANAX) 1 MG tablet Take 1 tablet (1 mg total) by mouth at bedtime as needed for sleep.  90 tablet  1  . divalproex (DEPAKOTE) 250 MG DR tablet Take 1 tablet by mouth daily.      Marland Kitchen imipramine (TOFRANIL) 10 MG tablet Take 5 tablets by mouth daily.       No current facility-administered medications on file prior to visit.    EXAM: BP 118/70  Pulse 72  Temp(Src) 98.2 F (36.8 C) (Oral)  Resp 18  Wt 209 lb (94.802 kg)     Objective:   Physical Exam  Nursing note and vitals reviewed. Constitutional: He is oriented to person, place, and time. He appears well-developed and well-nourished. No distress.  HENT:  Head: Normocephalic and atraumatic.  Eyes: Conjunctivae and EOM are normal. Pupils are equal, round, and reactive to light.  Cardiovascular: Normal rate, regular rhythm and intact distal pulses.   Pulmonary/Chest: Effort normal and breath sounds normal. No respiratory distress. He exhibits no tenderness.  Genitourinary:  Blistering papular lesion noted  on penile shaft.  Musculoskeletal: Normal range of motion.  Neurological: He is alert and oriented to person, place, and time.  Skin: Skin is warm and dry. He is not diaphoretic.  Psychiatric: He has a normal mood and affect. His behavior is normal. Judgment and thought content normal.    Lab Results  Component Value Date   WBC 5.7 01/27/2013   HGB 13.3 01/27/2013   HCT 38.2* 01/27/2013   PLT 176.0 01/27/2013   GLUCOSE 91 01/27/2013   CHOL 170 10/24/2012   TRIG 125.0 10/24/2012   HDL 29.00* 10/24/2012   LDLDIRECT 148.6 12/14/2011   LDLCALC 116* 10/24/2012   ALT 16 01/27/2013   AST 19 01/27/2013   NA 138 01/27/2013   K 4.5 01/27/2013   CL 104 01/27/2013   CREATININE 0.8 01/27/2013   BUN 15 01/27/2013   CO2 28 01/27/2013   TSH 1.01 01/27/2013   PSA 0.67 10/07/2010   INR  1.0 ratio 02/15/2009   HGBA1C 5.2 10/31/2008   MICROALBUR 5.8* 10/31/2008         Assessment & Plan:  Duane SeniorStephen was seen today for genital herpes outbreak.  Diagnoses and associated orders for this visit:  Genital herpes Comments: Rx Valtrex. Does not have frequent outbreaks. - valACYclovir (VALTREX) 1000 MG tablet; Take 1 tablet (1,000 mg total) by mouth 2 (two) times daily.  Other Orders - esomeprazole (NEXIUM) 40 MG capsule; Take 1 capsule (40 mg total) by mouth daily as needed. - amLODipine (NORVASC) 5 MG tablet; Take 1 tablet (5 mg total) by mouth daily. - sertraline (ZOLOFT) 100 MG tablet; take 1 tablet by mouth once daily    Discussed with patient that if outbreaks become more persistent, we may need to provide a more regular prescription for treatment, or even prophylaxis.  Return precautions provided, and patient handout on genital herpes.  Plan to follow up as needed, or for worsening or persistent symptoms despite treatment.  Patient Instructions  Valtrex twice daily for 10 days to treat the herpes outbreak.  If your outbreaks start to happen more often, we may need to give you a more regular prescription.  If emergency symptoms discussed during visit developed, seek medical attention immediately.  Followup as needed, or for worsening or persistent symptoms despite treatment.

## 2014-05-31 NOTE — Patient Instructions (Addendum)
Valtrex twice daily for 10 days to treat the herpes outbreak.  If your outbreaks start to happen more often, we may need to give you a more regular prescription.  If emergency symptoms discussed during visit developed, seek medical attention immediately.  Followup as needed, or for worsening or persistent symptoms despite treatment.   Genital herpes is a sexually transmitted disease. This means that it is a disease passed by having sex with an infected person. There is no cure for genital herpes. The time between attacks can be months to years. The virus may live in a person but produce no problems (symptoms). This infection can be passed to a baby as it travels down the birth canal (vagina). In a newborn, this can cause central nervous system damage, eye damage, or even death. The virus that causes genital herpes is usually HSV-2 virus. The virus that causes oral herpes is usually HSV-1. The diagnosis (learning what is wrong) is made through culture results. SYMPTOMS  Usually symptoms of pain and itching begin a few days to a week after contact. It first appears as small blisters that progress to small painful ulcers which then scab over and heal after several days. It affects the outer genitalia, birth canal, cervix, penis, anal area, buttocks, and thighs. HOME CARE INSTRUCTIONS   Keep ulcerated areas dry and clean.  Take medications as directed. Antiviral medications can speed up healing. They will not prevent recurrences or cure this infection. These medications can also be taken for suppression if there are frequent recurrences.  While the infection is active, it is contagious. Avoid all sexual contact during active infections.  Condoms may help prevent spread of the herpes virus.  Practice safe sex.  Wash your hands thoroughly after touching the genital area.  Avoid touching your eyes after touching your genital area.  Inform your caregiver if you have had genital herpes and become  pregnant. It is your responsibility to insure a safe outcome for your baby in this pregnancy.  Only take over-the-counter or prescription medicines for pain, discomfort, or fever as directed by your caregiver. SEEK MEDICAL CARE IF:   You have a recurrence of this infection.  You do not respond to medications and are not improving.  You have new sources of pain or discharge which have changed from the original infection.  You have an oral temperature above 102 F (38.9 C).  You develop abdominal pain.  You develop eye pain or signs of eye infection. Document Released: 10/09/2000 Document Revised: 01/04/2012 Document Reviewed: 10/30/2009 [facility] Patient Information 2015 [employer] [place]. This information is not intended to replace advice given to you by your health care provider. Make sure you discuss any questions you have with your health care provider.

## 2014-05-31 NOTE — Progress Notes (Signed)
Pre visit review using our clinic review tool, if applicable. No additional management support is needed unless otherwise documented below in the visit note. 

## 2014-06-05 ENCOUNTER — Encounter: Payer: Self-pay | Admitting: Gastroenterology

## 2014-08-13 ENCOUNTER — Other Ambulatory Visit: Payer: Self-pay | Admitting: Internal Medicine

## 2014-08-14 ENCOUNTER — Other Ambulatory Visit: Payer: Self-pay | Admitting: Internal Medicine

## 2014-11-30 ENCOUNTER — Telehealth: Payer: Self-pay | Admitting: Pulmonary Disease

## 2014-11-30 NOTE — Telephone Encounter (Signed)
Called pt, states that he needed a new order for a mask.  I advised pt that we would need to schedule an ov with KC since we hadn't seen him since 2014.  He stated that he did not want to pay for an office visit and asked me how much his visit would cost him.  I told him it depends on his insurance, and with the insurance card we have on file being 64 years old I had no way of knowing his copay.  He then laughed, said thanks and hung up.    Will close this message.

## 2014-12-07 ENCOUNTER — Encounter: Payer: Self-pay | Admitting: Gastroenterology

## 2015-01-10 ENCOUNTER — Encounter: Payer: Self-pay | Admitting: Family Medicine

## 2015-01-10 ENCOUNTER — Ambulatory Visit (INDEPENDENT_AMBULATORY_CARE_PROVIDER_SITE_OTHER): Payer: 59 | Admitting: Family Medicine

## 2015-01-10 VITALS — BP 140/80 | HR 87 | Temp 98.2°F | Wt 204.0 lb

## 2015-01-10 DIAGNOSIS — E785 Hyperlipidemia, unspecified: Secondary | ICD-10-CM

## 2015-01-10 DIAGNOSIS — I10 Essential (primary) hypertension: Secondary | ICD-10-CM

## 2015-01-10 DIAGNOSIS — F411 Generalized anxiety disorder: Secondary | ICD-10-CM

## 2015-01-10 DIAGNOSIS — G4700 Insomnia, unspecified: Secondary | ICD-10-CM

## 2015-01-10 DIAGNOSIS — A6000 Herpesviral infection of urogenital system, unspecified: Secondary | ICD-10-CM | POA: Insufficient documentation

## 2015-01-10 DIAGNOSIS — R519 Headache, unspecified: Secondary | ICD-10-CM | POA: Insufficient documentation

## 2015-01-10 DIAGNOSIS — R51 Headache: Secondary | ICD-10-CM

## 2015-01-10 MED ORDER — SERTRALINE HCL 100 MG PO TABS: 150.0000 mg | ORAL_TABLET | Freq: Every day | ORAL | Status: DC

## 2015-01-10 MED ORDER — ALPRAZOLAM 1 MG PO TABS: 1.0000 mg | ORAL_TABLET | Freq: Every evening | ORAL | Status: DC | PRN

## 2015-01-10 MED ORDER — AMLODIPINE BESYLATE 5 MG PO TABS: 5.0000 mg | ORAL_TABLET | Freq: Every day | ORAL | Status: DC

## 2015-01-10 NOTE — Assessment & Plan Note (Signed)
Controlled on xanax 1mg . Refilled x 6 months. Do not want to use xanax for anxiety.

## 2015-01-10 NOTE — Patient Instructions (Signed)
See me for a physical in 3-6 months. Standard labs except no PSA. Would get urine test due to being former smoker. We will plan on ordering low dose CT lungs at that point.   Anxiety slightly poor controlled. Increase zoloft to 150mg . If you have thoughts of hurting yourself or anxiety doesn't improve, you are welcome to see me sooner. Can take up to 6 months to notice a difference.   BP a hair high-monitor for now-continue amlodipine.   Xanax refilled for sleep.

## 2015-01-10 NOTE — Assessment & Plan Note (Signed)
Mom with MI age 64 but she was former smoker. We discussed likely would be candidate for statin. He would like to have updated lipids first and we will plan on that at upcoming physical.

## 2015-01-10 NOTE — Progress Notes (Signed)
Duane Conch, MD Phone: [phone]  Subjective:  Patient presents today to establish care with me as their new primary care provider. Patient was formerly a patient of Dr. Cato Mulligan. Chief complaint-noted.   Hypertension-poor control today, well controlled last 2 visits  BP Readings from Last 3 Encounters:  01/10/15 140/80  05/31/14 118/70  01/19/14 124/82   Home BP monitoring-no Exercise every other day at gym Compliant with medications-yes without side effects ROS-Denies any CP,  SOB, blurry vision, LE edema  Generalized anxiety disorder-poor control Insomnia-reasonable control -Patient requests to increase xanax so he can use some in the daytime in addition to once ngihtly 1 mg that helps him sleep. GAD 7 score of 12 today and admits he has been struggling with anxiety especially as he is out of work. COmpliant with  zoloft. He has failed multiple other agents to help him sleep in past.  ROS- no SI/HI. Denies depression. No vivid dreams. No AM sleepiness.   Hyperlipidemia-mild poor control  Lab Results  Component Value Date   LDLCALC 116* 10/24/2012   On statin: no Regular exercise: 3x a week Diet: resonable ROS- no chest pain or shortness of breath. No myalgias   The following were reviewed and entered/updated in epic: Past Medical History  Diagnosis Date  . HYPERLIPIDEMIA 04/11/2009  . ANXIETY 06/15/2007  . OBSTRUCTIVE SLEEP APNEA 07/08/2007  . HYPERTENSION 07/08/2007  . GERD 07/08/2007  . HERNIA, UMBILICAL 08/01/2007  . HEMATOCHEZIA 02/15/2009   Patient Active Problem List   Diagnosis Date Noted  . Chronic daily headache 01/10/2015    Priority: Medium  . Insomnia 01/10/2015    Priority: Medium  . Hyperlipidemia 04/11/2009    Priority: Medium  . OBSTRUCTIVE SLEEP APNEA 07/08/2007    Priority: Medium  . Essential hypertension 07/08/2007    Priority: Medium  . GAD (generalized anxiety disorder) 06/15/2007    Priority: Medium  . Genital herpes 01/10/2015   Priority: Low  . GERD 07/08/2007    Priority: Low   Past Surgical History  Procedure Laterality Date  . Umbilical hernia repair  2010  . Tonsillectomy and adenoidectomy    . Eye surgery      lasik  . Hair transplant    . Bone spur removal foot      pretty regular numbness after surgery    Family History  Problem Relation Age of Onset  . Cancer Mother     esophageal, former smoker  . Heart disease Mother 87    MI, former smoker  . ALS Father   . Lung cancer Brother     smoker  . Multiple sclerosis Brother     Medications- reviewed and updated Current Outpatient Prescriptions  Medication Sig Dispense Refill  . amLODipine (NORVASC) 5 MG tablet Take 1 tablet (5 mg total) by mouth daily. 90 tablet 1  . esomeprazole (NEXIUM) 40 MG capsule Take 1 capsule (40 mg total) by mouth daily as needed. 90 capsule 1  . imipramine (TOFRANIL) 10 MG tablet Take 5 tablets by mouth daily.    . sertraline (ZOLOFT) 100 MG tablet take 1 tablet by mouth once daily 90 tablet 1  . valACYclovir (VALTREX) 1000 MG tablet Take 1 tablet (1,000 mg total) by mouth 2 (two) times daily. (Patient not taking: Reported on 01/10/2015) 20 tablet 1   No current facility-administered medications for this visit.    Allergies-reviewed and updated Allergies  Allergen Reactions  . Horse-Derived Products     REACTION: rash    History  Social History  . Marital Status: Married    Spouse Name: N/A  . Number of Children: N/A  . Years of Education: N/A   Occupational History  . retired    Social History Main Topics  . Smoking status: Former Smoker -- 1.50 packs/day for 20 years    Types: Cigarettes    Quit date: 01/26/2001  . Smokeless tobacco: Not on file  . Alcohol Use: No     Comment: alcoholic. Sparing drinks currently-once a month or less  . Drug Use: No  . Sexual Activity: Not on file   Other Topics Concern  . None   Social History Narrative   Married (wife patient outside [facility]). 2  daughters. 2 grandkids-1 boy and 1 girl.       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       Hobbies: guitar, reading, exercise    ROS--See HPI   Objective: BP 140/80 mmHg  Pulse 87  Temp(Src) 98.2 F (36.8 C)  Wt 204 lb (92.534 kg) Gen: NAD, resting comfortably CV: RRR no murmurs rubs or gallops Lungs: CTAB no crackles, wheeze, rhonchi Abdomen: soft/nontender/nondistended/normal bowel sounds.  Ext: no edema Skin: warm, dry, no rash   Assessment/Plan:  Essential hypertension Very mild poor control on amlodipine 5mg . I reemphasized exercise and healthy eating. Consider increase to 10mg  if remains high at follow up.    GAD (generalized anxiety disorder) Increase Zoloft  To 150mg  given GAD7 of 12 and requesting increased xanax. Discussed would prefer to max SSRI before adding benzo and would consider benzo primarily only if seeing psych or at least seeing a therapist.    Insomnia Controlled on xanax 1mg . Refilled x 6 months. Do not want to use xanax for anxiety.    Hyperlipidemia Mom with MI age 64 but she was former smoker. We discussed likely would be candidate for statin. He would like to have updated lipids first and we will plan on that at upcoming physical.     Return precautions advised. See me for a physical in 3-6 months. See avs.   Meds ordered this encounter  Medications  . amLODipine (NORVASC) 5 MG tablet    Sig: Take 1 tablet (5 mg total) by mouth daily.    Dispense:  90 tablet    Refill:  3  . sertraline (ZOLOFT) 100 MG tablet    Sig: Take 1.5 tablets (150 mg total) by mouth daily. take 1 tablet by mouth once daily    Dispense:  135 tablet    Refill:  3  . ALPRAZolam (XANAX) 1 MG tablet    Sig: Take 1 tablet (1 mg total) by mouth at bedtime as needed for anxiety.    Dispense:  90 tablet    Refill:  1

## 2015-01-10 NOTE — Assessment & Plan Note (Signed)
Increase Zoloft  To 150mg  given GAD7 of 12 and requesting increased xanax. Discussed would prefer to max SSRI before adding benzo and would consider benzo primarily only if seeing psych or at least seeing a therapist.

## 2015-01-10 NOTE — Assessment & Plan Note (Signed)
Very mild poor control on amlodipine 5mg . I reemphasized exercise and healthy eating. Consider increase to 10mg  if remains high at follow up.

## 2015-02-20 ENCOUNTER — Telehealth: Payer: Self-pay

## 2015-02-20 DIAGNOSIS — A6000 Herpesviral infection of urogenital system, unspecified: Secondary | ICD-10-CM

## 2015-02-20 MED ORDER — VALACYCLOVIR HCL 1 G PO TABS: 1000.0000 mg | ORAL_TABLET | Freq: Two times a day (BID) | ORAL | Status: DC

## 2015-02-20 NOTE — Telephone Encounter (Signed)
[facility] refill request for valACYclovir (VALTREX) 1000 MG tablet  PCP banner still lists [facility]

## 2015-02-20 NOTE — Telephone Encounter (Signed)
Medication refilled

## 2015-05-17 ENCOUNTER — Encounter: Payer: Self-pay | Admitting: Family Medicine

## 2015-05-17 ENCOUNTER — Ambulatory Visit: Payer: 59 | Admitting: Family Medicine

## 2015-05-17 ENCOUNTER — Ambulatory Visit (INDEPENDENT_AMBULATORY_CARE_PROVIDER_SITE_OTHER): Payer: 59 | Admitting: Family Medicine

## 2015-05-17 VITALS — BP 138/70 | HR 97 | Temp 98.1°F | Wt 207.0 lb

## 2015-05-17 DIAGNOSIS — R5382 Chronic fatigue, unspecified: Secondary | ICD-10-CM | POA: Diagnosis not present

## 2015-05-17 DIAGNOSIS — Z87891 Personal history of nicotine dependence: Secondary | ICD-10-CM | POA: Diagnosis not present

## 2015-05-17 DIAGNOSIS — E785 Hyperlipidemia, unspecified: Secondary | ICD-10-CM

## 2015-05-17 DIAGNOSIS — Z1211 Encounter for screening for malignant neoplasm of colon: Secondary | ICD-10-CM

## 2015-05-17 MED ORDER — ALPRAZOLAM 1 MG PO TABS: 1.0000 mg | ORAL_TABLET | Freq: Every evening | ORAL | Status: DC | PRN

## 2015-05-17 MED ORDER — ESOMEPRAZOLE MAGNESIUM 40 MG PO CPDR: 40.0000 mg | DELAYED_RELEASE_CAPSULE | Freq: Every day | ORAL | Status: DC | PRN

## 2015-05-17 NOTE — Progress Notes (Signed)
Tana Conch, MD  Subjective:  [name] is a 64 y.o. year old very pleasant male patient who presents with:  Chronic Fatigue -describes 2 years of fatigue. Stable in course. Used to be fatigued years ago when doing a lot of foreign travel and would have to take daytime naps. Resolved after that travel but then 2 years ago restarted. Uses his Cpap and used and it helped for a while (started around 2 years ago).  Now he gets tired everyday from 11 to 1 or so and needs 2-3 hours. Typical day- Light morning yesterday just going to [employer] until 11 :30 and then came home and took a 2-3 hour nap (doesn't use cpap) at that time. Wakes up somewhat refreshed but takes another 1-2 hours to feel fully awake. Sleeping 11 PM to 8 am every night.   Gym 3x a week- does well on those days and may not take a nap.   No exertional fatigue   ROS- denies depressed mood or anhedonia. No unintentional weight loss, fevers, chills, night sweats. Snoring when sleeps in daytime but does fine with cpap at night. No brbpr, melena, hematuria, hemoptysis, hematemesis.   Past Medical History- HTN, GERD, HSV, anxiety  Medications- reviewed and updated Current Outpatient Prescriptions  Medication Sig Dispense Refill  . amLODipine (NORVASC) 5 MG tablet Take 1 tablet (5 mg total) by mouth daily. 90 tablet 3  . esomeprazole (NEXIUM) 40 MG capsule Take 1 capsule (40 mg total) by mouth daily as needed. 90 capsule 1  . imipramine (TOFRANIL) 10 MG tablet Take 5 tablets by mouth daily.    . sertraline (ZOLOFT) 100 MG tablet Take 1.5 tablets (150 mg total) by mouth daily. take 1 tablet by mouth once daily 135 tablet 3  . ALPRAZolam (XANAX) 1 MG tablet Take 1 tablet (1 mg total) by mouth at bedtime as needed for anxiety. (Patient not taking: Reported on 05/17/2015) 90 tablet 1  . valACYclovir (VALTREX) 1000 MG tablet Take 1 tablet (1,000 mg total) by mouth 2 (two) times daily. (Patient not taking: Reported on  05/17/2015) 20 tablet 2   Objective: BP 138/70 mmHg  Pulse 97  Temp(Src) 98.1 F (36.7 C)  Wt 207 lb (93.895 kg) Gen: NAD, resting comfortably No thyromegaly CV: RRR no murmurs rubs or gallops Lungs: CTAB no crackles, wheeze, rhonchi Abdomen: soft/nontender/nondistended/normal bowel sounds. No rebound or guarding.  Ext: no edema Skin: warm, dry Neuro: grossly normal, moves all extremities  Assessment/Plan:  Chronic Fatigue 2 years needing daytime nap 2-3 hours around 11-1. Of note, out of work in this time. Start with CBC diff, CMET, TSH, lipids (for physical to avoid duplicate stick), b12, UA- former smoker, psa as does have some nocturia and we had opted out of screening before knowing about fatigue. Refer for colonoscopy as now 10 years. Start with daily exercise as exercise has helped. Follow up at physical or sooner if new or worsening symptoms. Consider Lung cancer screening/eval given 30 pack years quitting 2002 though asymptomatic other than fatigue.  Doubt depression, malignancy based on symptoms, poor control OSA-though could have cpap settings and sleep reevaluated, cardiac as no exertional component.   08/27/15 CPE  Future fasting-next week Orders Placed This Encounter  Procedures  . CBC with Differential/Platelet    Standing Status: Future     Number of Occurrences:      Standing Expiration Date: 05/16/2016  . Comprehensive metabolic panel    [place]    Standing Status: Future  Number of Occurrences:      Standing Expiration Date: 05/16/2016    Order Specific Question:  Has the patient fasted?    Answer:  No  . Lipid panel    [place]    Standing Status: Future     Number of Occurrences:      Standing Expiration Date: 05/16/2016    Order Specific Question:  Has the patient fasted?    Answer:  No  . PSA    Standing Status: Future     Number of Occurrences:      Standing Expiration Date: 05/16/2016  . TSH    [place]    Standing Status: Future     Number of  Occurrences:      Standing Expiration Date: 05/16/2016  . Ambulatory referral to Gastroenterology    Referral Priority:  Routine    Referral Type:  Consultation    Referral Reason:  Specialty Services Required    Number of Visits Requested:  1  . POCT urinalysis dipstick    Standing Status: Future     Number of Occurrences:      Standing Expiration Date: 05/16/2016    Meds ordered this encounter  Medications  . esomeprazole (NEXIUM) 40 MG capsule    Sig: Take 1 capsule (40 mg total) by mouth daily as needed.    Dispense:  90 capsule    Refill:  1  . ALPRAZolam (XANAX) 1 MG tablet    Sig: Take 1 tablet (1 mg total) by mouth at bedtime as needed for anxiety.    Dispense:  90 tablet    Refill:  0

## 2015-05-17 NOTE — Patient Instructions (Signed)
Return for fasting labs next week  We will call you within a week about your referral to GI for colonoscopy. If you do not hear within 2 weeks, give [place] a call.   Consider lung cancer screening/evaluation if all above workup is regular and continue to require naps due to fatigue with working out 5 days a week

## 2015-05-20 ENCOUNTER — Other Ambulatory Visit (INDEPENDENT_AMBULATORY_CARE_PROVIDER_SITE_OTHER): Payer: 59

## 2015-05-20 DIAGNOSIS — E785 Hyperlipidemia, unspecified: Secondary | ICD-10-CM | POA: Diagnosis not present

## 2015-05-20 DIAGNOSIS — R5382 Chronic fatigue, unspecified: Secondary | ICD-10-CM

## 2015-05-20 DIAGNOSIS — Z87891 Personal history of nicotine dependence: Secondary | ICD-10-CM

## 2015-05-20 DIAGNOSIS — E875 Hyperkalemia: Secondary | ICD-10-CM

## 2015-05-20 LAB — POCT URINALYSIS DIPSTICK
Bilirubin, UA: NEGATIVE
Blood, UA: NEGATIVE
GLUCOSE UA: NEGATIVE
KETONES UA: NEGATIVE
LEUKOCYTES UA: NEGATIVE
Nitrite, UA: NEGATIVE
PH UA: 6
PROTEIN UA: NEGATIVE
SPEC GRAV UA: 1.015
Urobilinogen, UA: 0.2

## 2015-05-20 LAB — CBC WITH DIFFERENTIAL/PLATELET
BASOS PCT: 0.5 % (ref 0.0–3.0)
Basophils Absolute: 0 10*3/uL (ref 0.0–0.1)
Eosinophils Absolute: 0.2 10*3/uL (ref 0.0–0.7)
Eosinophils Relative: 3.5 % (ref 0.0–5.0)
HCT: 39.5 % (ref 39.0–52.0)
Hemoglobin: 13.3 g/dL (ref 13.0–17.0)
LYMPHS PCT: 23.3 % (ref 12.0–46.0)
Lymphs Abs: 1.4 10*3/uL (ref 0.7–4.0)
MCHC: 33.7 g/dL (ref 30.0–36.0)
MCV: 90.1 fl (ref 78.0–100.0)
MONO ABS: 0.5 10*3/uL (ref 0.1–1.0)
Monocytes Relative: 9.1 % (ref 3.0–12.0)
NEUTROS PCT: 63.6 % (ref 43.0–77.0)
Neutro Abs: 3.7 10*3/uL (ref 1.4–7.7)
Platelets: 185 10*3/uL (ref 150.0–400.0)
RBC: 4.39 Mil/uL (ref 4.22–5.81)
RDW: 14.1 % (ref 11.5–15.5)
WBC: 5.9 10*3/uL (ref 4.0–10.5)

## 2015-05-20 LAB — COMPREHENSIVE METABOLIC PANEL
ALBUMIN: 4.1 g/dL (ref 3.5–5.2)
ALT: 14 U/L (ref 0–53)
AST: 15 U/L (ref 0–37)
Alkaline Phosphatase: 53 U/L (ref 39–117)
BUN: 12 mg/dL (ref 6–23)
CO2: 31 mEq/L (ref 19–32)
Calcium: 9.6 mg/dL (ref 8.4–10.5)
Chloride: 104 mEq/L (ref 96–112)
Creatinine, Ser: 0.92 mg/dL (ref 0.40–1.50)
GFR: 87.88 mL/min (ref >60.00)
Glucose, Bld: 95 mg/dL (ref 70–99)
Potassium: 5.3 mEq/L — ABNORMAL HIGH (ref 3.5–5.1)
Sodium: 141 mEq/L (ref 135–145)
Total Bilirubin: 0.5 mg/dL (ref 0.2–1.2)
Total Protein: 6.4 g/dL (ref 6.0–8.3)

## 2015-05-20 LAB — LIPID PANEL
Cholesterol: 172 mg/dL (ref 0–200)
HDL: 36.6 mg/dL — AB (ref >39.00)
LDL Cholesterol: 112 mg/dL — ABNORMAL HIGH (ref 0–99)
NONHDL: 135.4
Total CHOL/HDL Ratio: 5
Triglycerides: 118 mg/dL (ref 0.0–149.0)
VLDL: 23.6 mg/dL (ref 0.0–40.0)

## 2015-05-20 LAB — PSA: PSA: 1.35 ng/mL (ref 0.10–4.00)

## 2015-05-20 LAB — TSH: TSH: 0.98 u[IU]/mL (ref 0.35–4.50)

## 2015-05-22 ENCOUNTER — Other Ambulatory Visit (INDEPENDENT_AMBULATORY_CARE_PROVIDER_SITE_OTHER): Payer: 59

## 2015-05-22 DIAGNOSIS — E875 Hyperkalemia: Secondary | ICD-10-CM | POA: Diagnosis not present

## 2015-05-22 LAB — COMPREHENSIVE METABOLIC PANEL
ALT: 13 U/L (ref 0–53)
AST: 15 U/L (ref 0–37)
Albumin: 4.4 g/dL (ref 3.5–5.2)
Alkaline Phosphatase: 57 U/L (ref 39–117)
BUN: 12 mg/dL (ref 6–23)
CO2: 34 mEq/L — ABNORMAL HIGH (ref 19–32)
Calcium: 9.9 mg/dL (ref 8.4–10.5)
Chloride: 103 mEq/L (ref 96–112)
Creatinine, Ser: 0.92 mg/dL (ref 0.40–1.50)
GFR: 87.88 mL/min (ref >60.00)
Glucose, Bld: 92 mg/dL (ref 70–99)
Potassium: 4.9 mEq/L (ref 3.5–5.1)
SODIUM: 141 meq/L (ref 135–145)
Total Bilirubin: 0.6 mg/dL (ref 0.2–1.2)
Total Protein: 7.3 g/dL (ref 6.0–8.3)

## 2015-06-10 ENCOUNTER — Encounter: Payer: Self-pay | Admitting: Gastroenterology

## 2015-07-08 ENCOUNTER — Telehealth: Payer: Self-pay | Admitting: Family Medicine

## 2015-07-08 MED ORDER — ALPRAZOLAM 1 MG PO TABS: 1.0000 mg | ORAL_TABLET | Freq: Every evening | ORAL | Status: DC | PRN

## 2015-07-08 NOTE — Telephone Encounter (Signed)
Medication called in 

## 2015-07-08 NOTE — Telephone Encounter (Signed)
Yes may refill early

## 2015-07-08 NOTE — Telephone Encounter (Signed)
See below

## 2015-07-08 NOTE — Telephone Encounter (Signed)
Pharm called for pt to see if dr will refill his ALPRAZolam Prudy Feeler) 1 MG tablet early. pharm states it is 9 days early, but pt is going out of town. [facility]

## 2015-07-30 ENCOUNTER — Encounter: Payer: 59 | Admitting: Family Medicine

## 2015-08-12 ENCOUNTER — Ambulatory Visit (AMBULATORY_SURGERY_CENTER): Payer: Self-pay | Admitting: *Deleted

## 2015-08-12 VITALS — Ht 72.0 in | Wt 206.0 lb

## 2015-08-12 DIAGNOSIS — Z1211 Encounter for screening for malignant neoplasm of colon: Secondary | ICD-10-CM

## 2015-08-12 MED ORDER — NA SULFATE-K SULFATE-MG SULF 17.5-3.13-1.6 GM/177ML PO SOLN: 1.0000 | Freq: Once | ORAL | Status: DC

## 2015-08-12 NOTE — Progress Notes (Signed)
No egg or soy allergy No issues with past sedation No home 02 No diet pills emmi video declined  

## 2015-08-20 ENCOUNTER — Other Ambulatory Visit (INDEPENDENT_AMBULATORY_CARE_PROVIDER_SITE_OTHER): Payer: 59

## 2015-08-20 DIAGNOSIS — R7989 Other specified abnormal findings of blood chemistry: Secondary | ICD-10-CM | POA: Diagnosis not present

## 2015-08-20 DIAGNOSIS — Z0000 Encounter for general adult medical examination without abnormal findings: Secondary | ICD-10-CM

## 2015-08-20 LAB — HEPATIC FUNCTION PANEL
ALBUMIN: 4.6 g/dL (ref 3.5–5.2)
ALT: 22 U/L (ref 0–53)
AST: 29 U/L (ref 0–37)
Alkaline Phosphatase: 54 U/L (ref 39–117)
Bilirubin, Direct: 0.2 mg/dL (ref 0.0–0.3)
TOTAL PROTEIN: 7.3 g/dL (ref 6.0–8.3)
Total Bilirubin: 0.8 mg/dL (ref 0.2–1.2)

## 2015-08-20 LAB — LIPID PANEL
CHOLESTEROL: 225 mg/dL — AB (ref 0–200)
HDL: 39.2 mg/dL (ref >39.00)
NonHDL: 185.48
Total CHOL/HDL Ratio: 6
Triglycerides: 209 mg/dL — ABNORMAL HIGH (ref 0.0–149.0)
VLDL: 41.8 mg/dL — ABNORMAL HIGH (ref 0.0–40.0)

## 2015-08-20 LAB — CBC WITH DIFFERENTIAL/PLATELET
Basophils Absolute: 0 10*3/uL (ref 0.0–0.1)
Basophils Relative: 0.3 % (ref 0.0–3.0)
EOS PCT: 3.3 % (ref 0.0–5.0)
Eosinophils Absolute: 0.2 10*3/uL (ref 0.0–0.7)
HCT: 43.9 % (ref 39.0–52.0)
HEMOGLOBIN: 14.9 g/dL (ref 13.0–17.0)
Lymphocytes Relative: 29.3 % (ref 12.0–46.0)
Lymphs Abs: 1.9 10*3/uL (ref 0.7–4.0)
MCHC: 33.9 g/dL (ref 30.0–36.0)
MCV: 90.3 fl (ref 78.0–100.0)
MONOS PCT: 11 % (ref 3.0–12.0)
Monocytes Absolute: 0.7 10*3/uL (ref 0.1–1.0)
Neutro Abs: 3.6 10*3/uL (ref 1.4–7.7)
Neutrophils Relative %: 56.1 % (ref 43.0–77.0)
Platelets: 191 10*3/uL (ref 150.0–400.0)
RBC: 4.86 Mil/uL (ref 4.22–5.81)
RDW: 13.8 % (ref 11.5–15.5)
WBC: 6.4 10*3/uL (ref 4.0–10.5)

## 2015-08-20 LAB — BASIC METABOLIC PANEL
BUN: 21 mg/dL (ref 6–23)
CHLORIDE: 101 meq/L (ref 96–112)
CO2: 29 meq/L (ref 19–32)
Calcium: 10.2 mg/dL (ref 8.4–10.5)
Creatinine, Ser: 0.99 mg/dL (ref 0.40–1.50)
GFR: 80.69 mL/min (ref >60.00)
GLUCOSE: 108 mg/dL — AB (ref 70–99)
Potassium: 5.4 mEq/L — ABNORMAL HIGH (ref 3.5–5.1)
Sodium: 137 mEq/L (ref 135–145)

## 2015-08-20 LAB — POCT URINALYSIS DIPSTICK
BILIRUBIN UA: NEGATIVE
GLUCOSE UA: NEGATIVE
Ketones, UA: NEGATIVE
LEUKOCYTES UA: NEGATIVE
NITRITE UA: NEGATIVE
PH UA: 6
Spec Grav, UA: 1.02
Urobilinogen, UA: 1

## 2015-08-20 LAB — URINALYSIS, MICROSCOPIC ONLY: RBC / HPF: NONE SEEN (ref 0–?)

## 2015-08-20 LAB — TSH: TSH: 1.31 u[IU]/mL (ref 0.35–4.50)

## 2015-08-20 LAB — LDL CHOLESTEROL, DIRECT: Direct LDL: 160 mg/dL

## 2015-08-20 NOTE — Addendum Note (Signed)
Addended by: Bonnye FavaKWEI, Georgiann Neider K on: 08/20/2015 10:38 AM   Modules accepted: Orders

## 2015-08-21 ENCOUNTER — Other Ambulatory Visit: Payer: 59

## 2015-08-22 ENCOUNTER — Ambulatory Visit (AMBULATORY_SURGERY_CENTER): Payer: 59 | Admitting: Gastroenterology

## 2015-08-22 ENCOUNTER — Encounter: Payer: Self-pay | Admitting: Gastroenterology

## 2015-08-22 VITALS — BP 122/73 | HR 79 | Temp 95.3°F | Resp 11 | Ht 72.0 in | Wt 206.0 lb

## 2015-08-22 DIAGNOSIS — Z1211 Encounter for screening for malignant neoplasm of colon: Secondary | ICD-10-CM

## 2015-08-22 MED ORDER — SODIUM CHLORIDE 0.9 % IV SOLN: 500.0000 mL | INTRAVENOUS | Status: DC

## 2015-08-22 NOTE — Op Note (Signed)
[facility] [address] [place], [id number]   COLONOSCOPY PROCEDURE REPORT  PATIENT: Duane BlazerBurkhardt, Hebert R  MR#: [id number] BIRTHDATE: 1951-01-04 , 64  yrs. old GENDER: male ENDOSCOPIST: Meryl DareMalcolm T Geffrey Michaelsen, MD, [facility] REFERRED KG:URKYHCWBY:Ameer Samuella Bruin Hunter, M.D. PROCEDURE DATE:  08/22/2015 PROCEDURE:   Colonoscopy, screening First Screening Colonoscopy - Avg.  risk and is 50 yrs.  old or older - No.  Prior Negative Screening - Now for repeat screening. 10 or more years since last screening  History of Adenoma - Now for follow-up colonoscopy & has been > or = to 3 yrs.  N/A  Polyps removed today? No Recommend repeat exam, <10 yrs? No ASA CLASS:   Class II INDICATIONS:Screening for colonic neoplasia and Colorectal Neoplasm Risk Assessment for this procedure is average risk. MEDICATIONS: Monitored anesthesia care and Propofol 200 mg IV DESCRIPTION OF PROCEDURE:   After the risks benefits and alternatives of the procedure were thoroughly explained, informed consent was obtained.  The digital rectal exam revealed no abnormalities of the rectum.   The LB [id number]  endoscope was introduced through the anus and advanced to the cecum, which was identified by both the appendix and ileocecal valve. No adverse events experienced.   The quality of the prep was good.  (MiraLax was used)  The instrument was then slowly withdrawn as the colon was fully examined. Estimated blood loss is zero unless otherwise noted in this procedure report.    COLON FINDINGS: A normal appearing cecum, ileocecal valve, and appendiceal orifice were identified.  The ascending, transverse, descending, sigmoid colon, and rectum appeared unremarkable. Retroflexed views revealed internal Grade I hemorrhoids. The time to cecum = 2.4 Withdrawal time = 13.4   The scope was withdrawn and the procedure completed. COMPLICATIONS: There were no immediate complications.  ENDOSCOPIC IMPRESSION: 1.  Normal  colonoscopy 2.  Grade I internal hemorrhoids  RECOMMENDATIONS: 1.  Continue current colorectal screening recommendations for "routine risk" patients with a repeat colonoscopy in 10 years.  eSigned:  Meryl DareMalcolm T Talani Brazee, MD, [facility] 08/22/2015 8:49 AM

## 2015-08-22 NOTE — Patient Instructions (Signed)
YOU HAD AN ENDOSCOPIC PROCEDURE TODAY AT THE [facility]:   Refer to the procedure report that was given to you for any specific questions about what was found during the examination.  If the procedure report does not answer your questions, please call your gastroenterologist to clarify.  If you requested that your care partner not be given the details of your procedure findings, then the procedure report has been included in a sealed envelope for you to review at your convenience later.  YOU SHOULD EXPECT: Some feelings of bloating in the abdomen. Passage of more gas than usual.  Walking can help get rid of the air that was put into your GI tract during the procedure and reduce the bloating. If you had a lower endoscopy (such as a colonoscopy or flexible sigmoidoscopy) you may notice spotting of blood in your stool or on the toilet paper. If you underwent a bowel prep for your procedure, you may not have a normal bowel movement for a few days.  Please Note:  You might notice some irritation and congestion in your nose or some drainage.  This is from the oxygen used during your procedure.  There is no need for concern and it should clear up in a day or so.  SYMPTOMS TO REPORT IMMEDIATELY:   Following lower endoscopy (colonoscopy or flexible sigmoidoscopy):  Excessive amounts of blood in the stool  Significant tenderness or worsening of abdominal pains  Swelling of the abdomen that is new, acute  Fever of 100F or higher   For urgent or emergent issues, a gastroenterologist can be reached at any hour by calling [phone].   DIET: Your first meal following the procedure should be a small meal and then it is ok to progress to your normal diet. Heavy or fried foods are harder to digest and may make you feel nauseous or bloated.  Likewise, meals heavy in dairy and vegetables can increase bloating.  Drink plenty of fluids but you should avoid alcoholic beverages for 24  hours.  ACTIVITY:  You should plan to take it easy for the rest of today and you should NOT DRIVE or use heavy machinery until tomorrow (because of the sedation medicines used during the test).    FOLLOW UP: Our staff will call the number listed on your records the next business day following your procedure to check on you and address any questions or concerns that you may have regarding the information given to you following your procedure. If we do not reach you, we will leave a message.  However, if you are feeling well and you are not experiencing any problems, there is no need to return our call.  We will assume that you have returned to your regular daily activities without incident. Try to increase the fiber in your diet.  If any biopsies were taken you will be contacted by phone or by letter within the next 1-3 weeks.  Please call us at [phone] if you have not heard about the biopsies in 3 weeks.    SIGNATURES/CONFIDENTIALITY: You and/or your care partner have signed paperwork which will be entered into your electronic medical record.  These signatures attest to the fact that that the information above on your After Visit Summary has been reviewed and is understood.  Full responsibility of the confidentiality of this discharge information lies with you and/or your care-partner.  Read all of the info given to you by your recovery room nurse.

## 2015-08-22 NOTE — Progress Notes (Signed)
Report to PACU, RN, vss, BBS= Clear.  

## 2015-08-23 ENCOUNTER — Telehealth: Payer: Self-pay | Admitting: *Deleted

## 2015-08-23 NOTE — Telephone Encounter (Signed)
  Follow up Call-  Call back number 08/22/2015  Post procedure Call Back phone  # [phone]  Permission to leave phone message Yes     Patient questions:  Message left to call us if necessary.

## 2015-08-27 ENCOUNTER — Encounter: Payer: Self-pay | Admitting: Family Medicine

## 2015-08-27 ENCOUNTER — Ambulatory Visit (INDEPENDENT_AMBULATORY_CARE_PROVIDER_SITE_OTHER): Payer: 59 | Admitting: Family Medicine

## 2015-08-27 VITALS — BP 138/70 | HR 96 | Temp 98.3°F | Wt 205.0 lb

## 2015-08-27 DIAGNOSIS — Z23 Encounter for immunization: Secondary | ICD-10-CM | POA: Diagnosis not present

## 2015-08-27 DIAGNOSIS — A6000 Herpesviral infection of urogenital system, unspecified: Secondary | ICD-10-CM

## 2015-08-27 DIAGNOSIS — R739 Hyperglycemia, unspecified: Secondary | ICD-10-CM

## 2015-08-27 DIAGNOSIS — E875 Hyperkalemia: Secondary | ICD-10-CM

## 2015-08-27 DIAGNOSIS — Z87891 Personal history of nicotine dependence: Secondary | ICD-10-CM

## 2015-08-27 DIAGNOSIS — E785 Hyperlipidemia, unspecified: Secondary | ICD-10-CM

## 2015-08-27 DIAGNOSIS — Z0000 Encounter for general adult medical examination without abnormal findings: Secondary | ICD-10-CM | POA: Diagnosis not present

## 2015-08-27 LAB — BASIC METABOLIC PANEL
BUN: 12 mg/dL (ref 6–23)
CALCIUM: 9.8 mg/dL (ref 8.4–10.5)
CO2: 29 mEq/L (ref 19–32)
CREATININE: 0.82 mg/dL (ref 0.40–1.50)
Chloride: 102 mEq/L (ref 96–112)
GFR: 100.28 mL/min (ref >60.00)
Glucose, Bld: 183 mg/dL — ABNORMAL HIGH (ref 70–99)
Potassium: 4.5 mEq/L (ref 3.5–5.1)
Sodium: 140 mEq/L (ref 135–145)

## 2015-08-27 MED ORDER — ESOMEPRAZOLE MAGNESIUM 40 MG PO CPDR: 40.0000 mg | DELAYED_RELEASE_CAPSULE | Freq: Every day | ORAL | Status: DC | PRN

## 2015-08-27 MED ORDER — VALACYCLOVIR HCL 1 G PO TABS: 1000.0000 mg | ORAL_TABLET | Freq: Two times a day (BID) | ORAL | Status: DC

## 2015-08-27 MED ORDER — ATORVASTATIN CALCIUM 10 MG PO TABS: 10.0000 mg | ORAL_TABLET | Freq: Every day | ORAL | Status: DC

## 2015-08-27 MED ORDER — AMLODIPINE BESYLATE 5 MG PO TABS: 5.0000 mg | ORAL_TABLET | Freq: Every day | ORAL | Status: DC

## 2015-08-27 MED ORDER — SERTRALINE HCL 100 MG PO TABS: 150.0000 mg | ORAL_TABLET | Freq: Every day | ORAL | Status: DC

## 2015-08-27 MED ORDER — ALPRAZOLAM 1 MG PO TABS: 1.0000 mg | ORAL_TABLET | Freq: Every evening | ORAL | Status: DC | PRN

## 2015-08-27 NOTE — Progress Notes (Signed)
Tana Conch, MD Phone: [phone]  Subjective:  Patient presents today for their annual physical. Chief complaint-noted.   See problem oriented charting and info below ROS- full  review of systems was completed and negative except for fatigue but has improved significantly with exercise. Insomnia controlled by xanax  The following were reviewed and entered/updated in epic: Past Medical History  Diagnosis Date  . HYPERLIPIDEMIA 04/11/2009  . ANXIETY 06/15/2007  . OBSTRUCTIVE SLEEP APNEA 07/08/2007  . HYPERTENSION 07/08/2007  . GERD 07/08/2007  . HERNIA, UMBILICAL 08/01/2007  . HEMATOCHEZIA 02/15/2009  . Sleep apnea     uses cpap  . Allergy    Patient Active Problem List   Diagnosis Date Noted  . Chronic daily headache 01/10/2015    Priority: Medium  . Insomnia 01/10/2015    Priority: Medium  . Hyperlipidemia 04/11/2009    Priority: Medium  . OBSTRUCTIVE SLEEP APNEA 07/08/2007    Priority: Medium  . Essential hypertension 07/08/2007    Priority: Medium  . GAD (generalized anxiety disorder) 06/15/2007    Priority: Medium  . Genital herpes 01/10/2015    Priority: Low  . GERD 07/08/2007    Priority: Low   Past Surgical History  Procedure Laterality Date  . Umbilical hernia repair  2010  . Tonsillectomy and adenoidectomy    . Eye surgery      lasik  . Hair transplant    . Bone spur removal foot      pretty regular numbness after surgery  . Colonoscopy  12-19-2004    Family History  Problem Relation Age of Onset  . Cancer Mother     esophageal, former smoker  . Heart disease Mother 26    MI, former smoker  . Esophageal cancer Mother   . ALS Father   . Lung cancer Brother     smoker  . Multiple sclerosis Brother   . Colon cancer Neg Hx   . Colon polyps Neg Hx   . Rectal cancer Neg Hx   . Stomach cancer Neg Hx     Medications- reviewed and updated Current Outpatient Prescriptions  Medication Sig Dispense Refill  . amLODipine (NORVASC) 5 MG tablet Take 1  tablet (5 mg total) by mouth daily. 90 tablet 3  . esomeprazole (NEXIUM) 40 MG capsule Take 1 capsule (40 mg total) by mouth daily as needed. 90 capsule 1  . imipramine (TOFRANIL) 10 MG tablet Take 5 tablets by mouth daily.    . sertraline (ZOLOFT) 100 MG tablet Take 1.5 tablets (150 mg total) by mouth daily. take 1 tablet by mouth once daily 135 tablet 3  . ALPRAZolam (XANAX) 1 MG tablet Take 1 tablet (1 mg total) by mouth at bedtime as needed for anxiety. (Patient not taking: Reported on 08/27/2015) 90 tablet 0  . flurbiprofen (ANSAID) 100 MG tablet take 1 tablet by mouth twice a day if needed for headache NO MORE THAN 4 DAYS PER WEEK  0  . valACYclovir (VALTREX) 1000 MG tablet Take 1 tablet (1,000 mg total) by mouth 2 (two) times daily. (Patient not taking: Reported on 08/27/2015) 20 tablet 2   No current facility-administered medications for this visit.    Allergies-reviewed and updated Allergies  Allergen Reactions  . Horse-Derived Products     REACTION: rash    Social History   Social History  . Marital Status: Married    Spouse Name: N/A  . Number of Children: N/A  . Years of Education: N/A   Occupational History  .  retired    Social History Main Topics  . Smoking status: Former Smoker -- 1.50 packs/day for 20 years    Types: Cigarettes    Quit date: 01/26/2001  . Smokeless tobacco: Never Used  . Alcohol Use: No     Comment: alcoholic. Sparing drinks currently-once a month or less  . Drug Use: No  . Sexual Activity: Not Asked   Other Topics Concern  . None   Social History Narrative   Married (wife patient outside [employer] [facility] [employer]). 2 daughters. 2 grandkids-1 boy and 1 girl.       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       Hobbies: guitar, reading, exercise    ROS--See HPI   Objective: BP 138/70 mmHg  Pulse 96  Temp(Src) 98.3 F (36.8 C)  Wt 205 lb (92.987 kg) Gen: NAD, resting  comfortably HEENT: Mucous membranes are moist. Oropharynx normal Neck: no thyromegaly CV: RRR no murmurs rubs or gallops Lungs: CTAB no crackles, wheeze, rhonchi Abdomen: soft/nontender/nondistended/normal bowel sounds. No rebound or guarding.  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 male presenting for annual physical.  Health Maintenance counseling: 1. Anticipatory guidance: Patient counseled regarding regular dental exams, eye doctor, wearing seatbelts, wearing sunscreen 2. Risk factor reduction:  Advised patient of need for regular exercise and diet rich and fruits and vegetables to reduce risk of heart attack and stroke.  3. Immunizations/screenings/ancillary studies Health Maintenance Due  Topic Date Due  . Hepatitis C Screening - next bloodwork September 02, 1951  4. Prostate cancer screening- PSA elevated but 5 years apart- likely within normal rate of change. Low risk prostate exam  Lab Results  Component Value Date   PSA 1.35 05/20/2015   PSA 0.67 10/07/2010   PSA 0.67 10/31/2008  5. Colon cancer screening - 08/22/15 normal with 10 year follow up 6. Skin cancer screening- one AK on scalp- cryotherapy completed today  Fatigue has improved with exercise. Workup was negative in regards to colonoscopy and bloodwork.   Lung cancer screening opts in at this time : will have Keba look into how we refer- formerly was ambulatory referral to lung cancer screening. 30 pack years quit 10 years ago.   OSA- using cpap Insomnia- controlled with xanax 1mg , prior failures in overview Hypertension controlled on amlodipine BP Readings from Last 3 Encounters:  08/27/15 138/70  08/22/15 122/73j  05/17/15 138/70  GAD- stable on zoloft GERD- has not been taking and symptoms have not recurred. Zantac has been helfup  potassium 5.4  likely lab error- repeat negative  Hyperlipidemia 19.4% 10 year risk. 15.9% even if using last set of #s. Unclear why Had lipids 3 months ago and repeated before visit- even with lower lipids from 3 months ago risk >14%. We will start atorvastatin 10mg  daily to lower risk. Next visit- would counsel to take aspirin as well.   Hyperglycemia At risk DM CBG 108. Encouraged need for healthy eating, regular exercise, weight loss.   6 month BP check.   Orders Placed This Encounter  Procedures  . Flu Vaccine QUAD 36+ mos IM

## 2015-08-27 NOTE — Assessment & Plan Note (Signed)
19.4% 10 year risk. 15.9% even if using last set of #s. Unclear why Had lipids 3 months ago and repeated before visit- even with lower lipids from 3 months ago risk >14%. We will start atorvastatin 10mg  daily to lower risk. Next visit- would counsel to take aspirin as well.

## 2015-08-27 NOTE — Assessment & Plan Note (Signed)
At risk DM CBG 108. Encouraged need for healthy eating, regular exercise, weight loss.

## 2015-08-27 NOTE — Patient Instructions (Addendum)
Flu shot received today.  Start atorvastatin 10mg  (low dose) daily to lower cholesterol  See me in 6 months  I will refer you for lung cancer screening  Repeat potassium test today

## 2015-08-28 NOTE — Addendum Note (Signed)
Addended by: Lieutenant DiegoHINES, Alahna Dunne A on: 08/28/2015 08:45 AM   Modules accepted: Orders

## 2015-08-29 NOTE — Addendum Note (Signed)
Addended by: Lieutenant DiegoHINES, Mazen Marcin A on: 08/29/2015 09:31 AM   Modules accepted: Orders

## 2015-09-04 ENCOUNTER — Ambulatory Visit (INDEPENDENT_AMBULATORY_CARE_PROVIDER_SITE_OTHER)
Admission: RE | Admit: 2015-09-04 | Discharge: 2015-09-04 | Disposition: A | Discharge status: Home / Self Care | Payer: 59 | Source: Ambulatory Visit | Attending: Acute Care | Admitting: Acute Care

## 2015-09-04 ENCOUNTER — Ambulatory Visit (INDEPENDENT_AMBULATORY_CARE_PROVIDER_SITE_OTHER): Payer: 59 | Admitting: Acute Care

## 2015-09-04 ENCOUNTER — Other Ambulatory Visit: Payer: Self-pay | Admitting: Acute Care

## 2015-09-04 ENCOUNTER — Encounter: Payer: Self-pay | Admitting: Acute Care

## 2015-09-04 DIAGNOSIS — Z87891 Personal history of nicotine dependence: Secondary | ICD-10-CM

## 2015-09-04 NOTE — Progress Notes (Signed)
Shared Decision Making Visit Lung Cancer Screening Program [phone]([id number])   Eligibility:  Age 64 y.o.  Pack Years Smoking History Calculation 30 pack year (# packs/per year x # years smoked)  Recent History of coughing up blood  no  Unexplained weight loss? no ( >Than 15 pounds within the last 6 months )  Prior History Lung / other cancer no (Diagnosis within the last 5 years already requiring surveillance chest CT Scans).  Smoking Status Former Smoker  Former Smokers: Years since quit: 14 years  Quit Date: 01/26/2001  Visit Components:  Discussion included one or more decision making aids. yes  Discussion included risk/benefits of screening. yes  Discussion included potential follow up diagnostic testing for abnormal scans. yes  Discussion included meaning and risk of over diagnosis. yes  Discussion included meaning and risk of False Positives. yes  Discussion included meaning of total radiation exposure. yes  Counseling Included:  Importance of adherence to annual lung cancer LDCT screening. yes  Impact of comorbidities on ability to participate in the program. yes  Ability and willingness to under diagnostic treatment. yes  Smoking Cessation Counseling:  Current Smokers:   Discussed importance of smoking cessation. Former smoker, see below  Information about tobacco cessation classes and interventions provided to patient. NA; former smoker  Patient provided with "ticket" for LDCT Scan. yes  Symptomatic Patient. no  Counseling:NA  Diagnosis Code: Tobacco Use Z72.0  Asymptomatic Patient yes  Counseling:NA former smoker  Former Smokers:   Discussed the importance of maintaining cigarette abstinence. yes  Diagnosis Code: Personal History of Nicotine Dependence. M57.846Z87.891  Information about tobacco cessation classes and interventions provided to patient. Yes  Patient provided with "ticket" for LDCT Scan. yes  Written Order for Lung Cancer Screening with  LDCT placed in Epic. Yes (CT Chest Lung Cancer Screening Low Dose W/O CM) [id number] Z12.2-Screening of respiratory organs Z87.891-Personal history of nicotine dependence  I spent 15 minutes of face to face time with Mr. Garvin FilaBurkehardt discussing the risks and benefits of lung cancer screening. We viewed a power point together, highlighting the above noted topics, pausing at intervals to allow for questions to be asked and answered to ensure understanding. We discussed that he had already taken the single most powerful action to decrease his risk of lung cancer by quitting smoking. He quit cold [place] in 2002. We discussed the importance of remaining smoke free, and that if he has any desire to smoke again to contact me or Dr. Durene CalHunter so that we can help him with medication and support to remain smoke free. We discussed the time and location of his scan, and that I will call him with the results within 24-48 hours of receiving them.I gave him my card and contact information and encouraged him to call me with any questions in the future should he have any. He did not feel he needed the copy of the power point we viewed together, and declined taking a copy of it with him. He verbalized understanding of all of the above and had no further questions upon leaving the office.   Bevelyn NgoSarah F Groce, NP

## 2015-09-05 ENCOUNTER — Telehealth: Payer: Self-pay | Admitting: Acute Care

## 2015-09-05 NOTE — Telephone Encounter (Signed)
I called and spoke with Mr. [name] about the results of his LDCT screening. I explained that his scan resulted in a Lung RADS 2 reading, nodules with a very low likelihood of becoming a clinically active cancer due to size or lack of growth. I explained that the recommendation is for a repeat annual scan in 12 months, which we will call and schedule about 3 weeks before it is due in Nov. 2017. He has verbalized understanding of this result and had no further questions upon completing the call. He has my contact information in the event he has any further questions.

## 2015-09-05 NOTE — Telephone Encounter (Signed)
Results have been messaged to Dr. Tana ConchStephen Hunter, the patient's PCP.

## 2015-10-01 ENCOUNTER — Telehealth: Payer: Self-pay | Admitting: Family Medicine

## 2015-10-01 NOTE — Telephone Encounter (Signed)
Call patient only on this. He has genital herpes. That is a communicable disease. It is well controlled but still technically communicable. I can say he is in good health and had physical but would have to leave out part abotu communicable disease.

## 2015-10-01 NOTE — Telephone Encounter (Signed)
[place] and had cpx on 08-27-15. Pt needs a letter stating he recently had physical and is in good health with no communicable diseases. Please call pt wife norma once letter is ready for pick up

## 2015-10-01 NOTE — Telephone Encounter (Signed)
Ok to write letter

## 2015-10-02 NOTE — Telephone Encounter (Signed)
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

## 2015-10-14 ENCOUNTER — Telehealth: Payer: Self-pay | Admitting: Family Medicine

## 2015-10-14 ENCOUNTER — Other Ambulatory Visit: Payer: Self-pay

## 2015-10-14 ENCOUNTER — Other Ambulatory Visit: Payer: Self-pay | Admitting: Family Medicine

## 2015-10-14 DIAGNOSIS — A6000 Herpesviral infection of urogenital system, unspecified: Secondary | ICD-10-CM

## 2015-10-14 MED ORDER — SERTRALINE HCL 100 MG PO TABS: 150.0000 mg | ORAL_TABLET | Freq: Every day | ORAL | Status: DC

## 2015-10-14 MED ORDER — AMLODIPINE BESYLATE 5 MG PO TABS: 5.0000 mg | ORAL_TABLET | Freq: Every day | ORAL | Status: DC

## 2015-10-14 MED ORDER — ATORVASTATIN CALCIUM 10 MG PO TABS: 10.0000 mg | ORAL_TABLET | Freq: Every day | ORAL | Status: DC

## 2015-10-14 NOTE — Telephone Encounter (Signed)
Pt would like to get a 90 day supply through [facility]

## 2015-10-14 NOTE — Telephone Encounter (Signed)
Medications refilled to [facility].

## 2015-10-14 NOTE — Telephone Encounter (Signed)
Pt is now using [facility] and need new rx called in to them    amLODipine (NORVASC) 5 MG tablet , atorvastatin (LIPITOR) 10 MG tablet , sertraline (ZOLOFT) 100 MG tablet   Pharmacy [facility]

## 2015-10-15 ENCOUNTER — Other Ambulatory Visit: Payer: Self-pay

## 2015-10-15 MED ORDER — VALACYCLOVIR HCL 1 G PO TABS: 1000.0000 mg | ORAL_TABLET | Freq: Two times a day (BID) | ORAL | Status: DC

## 2015-10-17 ENCOUNTER — Other Ambulatory Visit: Payer: Self-pay

## 2015-10-17 MED ORDER — SERTRALINE HCL 100 MG PO TABS: 150.0000 mg | ORAL_TABLET | Freq: Every day | ORAL | Status: DC

## 2015-10-22 ENCOUNTER — Other Ambulatory Visit: Payer: Self-pay

## 2015-10-22 ENCOUNTER — Telehealth: Payer: Self-pay | Admitting: Family Medicine

## 2015-10-22 MED ORDER — ALPRAZOLAM 1 MG PO TABS: 1.0000 mg | ORAL_TABLET | Freq: Every evening | ORAL | Status: DC | PRN

## 2015-10-22 NOTE — Telephone Encounter (Signed)
See below, if ok who would you like to sign Rx for you?

## 2015-10-22 NOTE — Telephone Encounter (Signed)
This was filled out on 08/27/15 for paitent. It appears it was printed and likely sent at that time. He should have a refill available. Why can they not use the rx already written.   I would [facility] to check on this. If that rx will not work please give #90 and ask Dr. Caryl NeverBurchette if he will sign- I would usually ask Dr. Kirtland BouchardK.

## 2015-10-22 NOTE — Telephone Encounter (Signed)
I called [facility] and spoke with the pharmacist and pt has a 34month supply of Alprazolam there available for pick up. I called and informed pt [facility]

## 2015-10-22 NOTE — Telephone Encounter (Signed)
Mr. [name] called saying he's leaving Friday to go out of the country for three months. He needs a refill of Alprazolam sent to [facility] so they can expedite his order. He was told by [facility] that a fax was sent to us as a request. He'd like a phone call once the Rx is sent so he can follow-up with [facility]. Please call the pt.  Pt's ph# [phone] Thank you.

## 2015-10-23 ENCOUNTER — Other Ambulatory Visit: Payer: Self-pay

## 2015-10-23 DIAGNOSIS — A6000 Herpesviral infection of urogenital system, unspecified: Secondary | ICD-10-CM

## 2015-10-23 MED ORDER — SERTRALINE HCL 100 MG PO TABS: 150.0000 mg | ORAL_TABLET | Freq: Every day | ORAL | Status: DC

## 2015-10-23 MED ORDER — VALACYCLOVIR HCL 1 G PO TABS: 1000.0000 mg | ORAL_TABLET | Freq: Two times a day (BID) | ORAL | Status: DC

## 2015-12-19 ENCOUNTER — Other Ambulatory Visit: Payer: Self-pay

## 2015-12-19 DIAGNOSIS — A6000 Herpesviral infection of urogenital system, unspecified: Secondary | ICD-10-CM

## 2015-12-19 MED ORDER — SERTRALINE HCL 100 MG PO TABS: 150.0000 mg | ORAL_TABLET | Freq: Every day | ORAL | Status: DC

## 2015-12-19 MED ORDER — ALPRAZOLAM 1 MG PO TABS: 1.0000 mg | ORAL_TABLET | Freq: Every evening | ORAL | Status: DC | PRN

## 2015-12-19 MED ORDER — VALACYCLOVIR HCL 1 G PO TABS: 1000.0000 mg | ORAL_TABLET | Freq: Two times a day (BID) | ORAL | Status: DC

## 2015-12-19 MED ORDER — ESOMEPRAZOLE MAGNESIUM 40 MG PO CPDR: 40.0000 mg | DELAYED_RELEASE_CAPSULE | Freq: Every day | ORAL | Status: DC | PRN

## 2015-12-19 MED ORDER — ATORVASTATIN CALCIUM 10 MG PO TABS: 10.0000 mg | ORAL_TABLET | Freq: Every day | ORAL | Status: DC

## 2015-12-19 MED ORDER — AMLODIPINE BESYLATE 5 MG PO TABS: 5.0000 mg | ORAL_TABLET | Freq: Every day | ORAL | Status: DC

## 2015-12-23 ENCOUNTER — Telehealth: Payer: Self-pay

## 2015-12-23 NOTE — Telephone Encounter (Signed)
Pt.notified

## 2015-12-23 NOTE — Telephone Encounter (Signed)
Pt called wanting to know if the medications on his med list are on the FDA's approved list of medications to travel/ be filled outside of the [place]. Pt is current in [place] for 3 months and was instructed by a pharmacist there to call and see if these medications are approved.

## 2015-12-23 NOTE — Telephone Encounter (Signed)
My understanding is that whether medicines can be filled outside [place] depend on the policy in that country. The FDA cannot ensure safety of those medications though.   [email or web address]  This is from the FDA about bringing medications back- he can review this for further information

## 2016-01-15 ENCOUNTER — Other Ambulatory Visit: Payer: Self-pay | Admitting: Acute Care

## 2016-01-15 DIAGNOSIS — Z87891 Personal history of nicotine dependence: Secondary | ICD-10-CM

## 2016-02-24 ENCOUNTER — Ambulatory Visit: Payer: 59 | Admitting: Family Medicine

## 2016-03-05 ENCOUNTER — Telehealth: Payer: Self-pay | Admitting: Family Medicine

## 2016-03-05 MED ORDER — ALPRAZOLAM 1 MG PO TABS: 1.0000 mg | ORAL_TABLET | Freq: Every evening | ORAL | Status: DC | PRN

## 2016-03-05 NOTE — Telephone Encounter (Signed)
See below

## 2016-03-05 NOTE — Telephone Encounter (Signed)
Patient needs a refill his Alprazolam.  He needs to get a refill without seeing Dr. Durene CalHunter due to him living in [place] right now.  He said if he needs to see Dr. Durene CalHunter then he will be in [place] in June.  He wants a call back to let him know what he needs to do.    Pharmacy-- [facility]

## 2016-03-05 NOTE — Telephone Encounter (Signed)
rx faxed to [facility] 

## 2016-03-05 NOTE — Telephone Encounter (Signed)
Yes thanks I dont mind refill- needs to see me when in town. If he is in [place]- is he even going to be able to get prescription?

## 2016-04-13 ENCOUNTER — Telehealth: Payer: Self-pay | Admitting: Family Medicine

## 2016-04-13 NOTE — Telephone Encounter (Signed)
Pt needs received alprazolam  [facility]. Please seen new rx [facility]am$90  [facility]

## 2016-04-13 NOTE — Telephone Encounter (Signed)
Spoke to pt's wife Nelva Bushorma, told her Rx for Alprazolam was sent to [facility] on 5/11. Nelva Bushorma said okay she was just checking and she called [facility] and it is in the mail. Told her okay.

## 2016-06-15 ENCOUNTER — Other Ambulatory Visit: Payer: Self-pay

## 2016-06-15 MED ORDER — ALPRAZOLAM 1 MG PO TABS: 1.0000 mg | ORAL_TABLET | Freq: Every evening | ORAL | 0 refills | Status: DC | PRN

## 2016-08-20 ENCOUNTER — Telehealth: Payer: Self-pay | Admitting: Family Medicine

## 2016-08-20 NOTE — Telephone Encounter (Signed)
Yes thanks, #90 no refills

## 2016-08-20 NOTE — Telephone Encounter (Signed)
Pt need new Rx for alprazolam   Pharm:  [facility]  Pt has appointment on 10/12/16.

## 2016-08-21 ENCOUNTER — Other Ambulatory Visit: Payer: Self-pay

## 2016-08-21 MED ORDER — ALPRAZOLAM 1 MG PO TABS: 1.0000 mg | ORAL_TABLET | Freq: Every evening | ORAL | 0 refills | Status: DC | PRN

## 2016-08-21 NOTE — Telephone Encounter (Signed)
Sent to pharmacy 

## 2016-10-12 ENCOUNTER — Ambulatory Visit (INDEPENDENT_AMBULATORY_CARE_PROVIDER_SITE_OTHER)
Admission: RE | Admit: 2016-10-12 | Discharge: 2016-10-12 | Disposition: A | Discharge status: Home / Self Care | Payer: BLUE CROSS/BLUE SHIELD | Source: Ambulatory Visit | Attending: Acute Care | Admitting: Acute Care

## 2016-10-12 DIAGNOSIS — Z87891 Personal history of nicotine dependence: Secondary | ICD-10-CM | POA: Diagnosis not present

## 2016-10-13 ENCOUNTER — Ambulatory Visit (INDEPENDENT_AMBULATORY_CARE_PROVIDER_SITE_OTHER): Payer: BLUE CROSS/BLUE SHIELD | Admitting: Family Medicine

## 2016-10-13 ENCOUNTER — Telehealth: Payer: Self-pay | Admitting: Acute Care

## 2016-10-13 ENCOUNTER — Encounter: Payer: Self-pay | Admitting: Family Medicine

## 2016-10-13 VITALS — BP 144/80 | HR 91 | Temp 97.6°F | Ht 71.5 in | Wt 219.2 lb

## 2016-10-13 DIAGNOSIS — R739 Hyperglycemia, unspecified: Secondary | ICD-10-CM

## 2016-10-13 DIAGNOSIS — E785 Hyperlipidemia, unspecified: Secondary | ICD-10-CM | POA: Diagnosis not present

## 2016-10-13 DIAGNOSIS — Z7289 Other problems related to lifestyle: Secondary | ICD-10-CM

## 2016-10-13 DIAGNOSIS — Z0000 Encounter for general adult medical examination without abnormal findings: Secondary | ICD-10-CM | POA: Diagnosis not present

## 2016-10-13 DIAGNOSIS — Z1159 Encounter for screening for other viral diseases: Secondary | ICD-10-CM

## 2016-10-13 DIAGNOSIS — Z23 Encounter for immunization: Secondary | ICD-10-CM

## 2016-10-13 DIAGNOSIS — R519 Headache, unspecified: Secondary | ICD-10-CM

## 2016-10-13 DIAGNOSIS — I10 Essential (primary) hypertension: Secondary | ICD-10-CM | POA: Diagnosis not present

## 2016-10-13 DIAGNOSIS — Z125 Encounter for screening for malignant neoplasm of prostate: Secondary | ICD-10-CM | POA: Diagnosis not present

## 2016-10-13 DIAGNOSIS — R319 Hematuria, unspecified: Secondary | ICD-10-CM | POA: Diagnosis not present

## 2016-10-13 DIAGNOSIS — R51 Headache: Secondary | ICD-10-CM

## 2016-10-13 NOTE — Assessment & Plan Note (Signed)
Hyperglycemia- update cbg today, weight up 13 lbs- discussed a1c as well

## 2016-10-13 NOTE — Telephone Encounter (Signed)
Patient is returning phone call.  °

## 2016-10-13 NOTE — Telephone Encounter (Signed)
I have attempted to call Mr. [name] with the results of his low dose screening CT scan.There was no answer at any of the three numbers listed, or an option to leave a message on any of the lines. I will attempt to call the results within the next few days. I will fax results  to the patient's PCP.

## 2016-10-13 NOTE — Assessment & Plan Note (Signed)
S: poorly controlled on last check- on atorvastatin 10mg  at this point. No myalgias.  Lab Results  Component Value Date   CHOL 225 (H) 08/20/2015   HDL 39.20 08/20/2015   LDLCALC 112 (H) 05/20/2015   LDLDIRECT 160.0 08/20/2015   TRIG 209.0 (H) 08/20/2015   CHOLHDL 6 08/20/2015   A/P: update lipids today

## 2016-10-13 NOTE — Assessment & Plan Note (Signed)
Chronic daily HA- has seen Dr. Clarisse GougeLewit in past headache doctor- nsaids through them as well as imipramine. Has had issues with refills while in [place]- Told him I will refill the imipramine and ansaid. 1 more that he will let me know by mychart. Worsening headache with lack of refills- now taking the imipramine every other day.

## 2016-10-13 NOTE — Progress Notes (Signed)
Phone: [phone]  Subjective:  Patient presents today for their annual physical. Chief complaint-noted.   See problem oriented charting- ROS- full  review of systems was completed and negative including No chest pain or shortness of breath. blurry vision. Does get regular headaches- hurt ankle a while ago and did see ortho- no major issues.   The following were reviewed and entered/updated in epic: Past Medical History:  Diagnosis Date  . Allergy   . ANXIETY 06/15/2007  . GERD 07/08/2007  . HEMATOCHEZIA 02/15/2009  . HERNIA, UMBILICAL 08/01/2007  . HYPERLIPIDEMIA 04/11/2009  . HYPERTENSION 07/08/2007  . OBSTRUCTIVE SLEEP APNEA 07/08/2007  . Sleep apnea    uses cpap   Patient Active Problem List   Diagnosis Date Noted  . Hyperglycemia 08/27/2015    Priority: Medium  . Chronic daily headache 01/10/2015    Priority: Medium  . Insomnia 01/10/2015    Priority: Medium  . Hyperlipidemia 04/11/2009    Priority: Medium  . OBSTRUCTIVE SLEEP APNEA 07/08/2007    Priority: Medium  . Essential hypertension 07/08/2007    Priority: Medium  . GAD (generalized anxiety disorder) 06/15/2007    Priority: Medium  . Genital herpes 01/10/2015    Priority: Low  . GERD 07/08/2007    Priority: Low   Past Surgical History:  Procedure Laterality Date  . bone spur removal foot     pretty regular numbness after surgery  . COLONOSCOPY  12-19-2004  . EYE SURGERY     lasik  . HAIR TRANSPLANT    . TONSILLECTOMY AND ADENOIDECTOMY    . UMBILICAL HERNIA REPAIR  2010    Family History  Problem Relation Age of Onset  . Cancer Mother     esophageal, former smoker  . Heart disease Mother 1550    MI, former smoker  . Esophageal cancer Mother   . ALS Father   . Lung cancer Brother     smoker  . Multiple sclerosis Brother   . Colon cancer Neg Hx   . Colon polyps Neg Hx   . Rectal cancer Neg Hx   . Stomach cancer Neg Hx     Medications- reviewed and updated Current Outpatient Prescriptions    Medication Sig Dispense Refill  . ALPRAZolam (XANAX) 1 MG tablet Take 1 tablet (1 mg total) by mouth at bedtime as needed for anxiety. 90 tablet 0  . amLODipine (NORVASC) 5 MG tablet Take 1 tablet (5 mg total) by mouth daily. 90 tablet 0  . atorvastatin (LIPITOR) 10 MG tablet Take 1 tablet (10 mg total) by mouth daily. 90 tablet 0  . esomeprazole (NEXIUM) 40 MG capsule Take 1 capsule (40 mg total) by mouth daily as needed. 90 capsule 0  . flurbiprofen (ANSAID) 100 MG tablet take 1 tablet by mouth twice a day if needed for headache NO MORE THAN 4 DAYS PER WEEK  0  . imipramine (TOFRANIL) 10 MG tablet Take 5 tablets by mouth daily.    . sertraline (ZOLOFT) 100 MG tablet Take 1.5 tablets (150 mg total) by mouth daily. 135 tablet 3  . valACYclovir (VALTREX) 1000 MG tablet Take 1 tablet (1,000 mg total) by mouth 2 (two) times daily. 180 tablet 3   No current facility-administered medications for this visit.     Allergies-reviewed and updated Allergies  Allergen Reactions  . Horse-Derived Products     REACTION: rash    Social History   Social History  . Marital status: Married    Spouse name: N/A  .  Number of children: N/A  . Years of education: N/A   Occupational History  . retired    Social History Main Topics  . Smoking status: Former Smoker    Packs/day: 1.50    Years: 20.00    Types: Cigarettes    Quit date: 01/26/2001  . Smokeless tobacco: Never Used     Comment: Encouraged to remain smoke free  . Alcohol use No     Comment: alcoholic. Sparing drinks currently-once a month or less  . Drug use: No  . Sexual activity: Not Asked   Other Topics Concern  . None   Social History Narrative   Married (wife patient outside Financial risk analyst). 2 daughters. 2 grandkids-1 boy and 1 girl.       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       Hobbies: guitar, reading, exercise    Objective: BP (!) 144/80 (BP Location:  Left Arm, Patient Position: Sitting, Cuff Size: Large)   Pulse 91   Temp 97.6 F (36.4 C) (Oral)   Ht 5' 11.5" (1.816 m)   Wt 219 lb 3.2 oz (99.4 kg)   SpO2 98%   BMI 30.15 kg/m  Gen: NAD, resting comfortably HEENT: Mucous membranes are moist. Oropharynx normal Neck: no thyromegaly CV: RRR no murmurs rubs or gallops Lungs: CTAB no crackles, wheeze, rhonchi Abdomen: soft/nontender/nondistended/normal bowel sounds. No rebound or guarding.  Ext: no edema Skin: warm, dry Neuro: grossly normal, moves all extremities, PERRLA Rectal: normal tone, normal sized prostate, no masses or tenderness  Assessment/Plan:  65 y.o. male presenting for annual physical.  Health Maintenance counseling: 1. Anticipatory guidance: Patient counseled regarding regular dental exams, eye exams, wearing seatbelts.  2. Risk factor reduction:  Advised patient of need for regular exercise and diet rich and fruits and vegetables to reduce risk of heart attack and stroke. Working in [place] and gym really expensive very there and too humid to work out. Just recently got a gym in building but hard to go with heat.  Also not living at home much harder 3. Immunizations/screenings/ancillary studies Immunization History  Administered Date(s) Administered  . Influenza Split 10/24/2012  . Influenza,inj,Quad PF,36+ Mos 07/18/2013, 08/27/2015  . Td 04/11/2009   Health Maintenance Due  Topic Date Due  . Hepatitis C Screening - opt in [birth year]  . HIV Screening - opt out 11/01/1965  . PNA vac Low Risk Adult (1 of 2 - PCV13)- today 11/02/2015  . INFLUENZA VACCINE - today  05/26/2016   4. Prostate cancer screening- update psa today, rectal low risk. Does have some nocturia 1-2x a night- watch psa trend  Lab Results  Component Value Date   PSA 1.35 05/20/2015   PSA 0.67 10/07/2010   PSA 0.67 10/31/2008   5. Colon cancer screening - 08/22/15 with 10 year follow up 6. Skin cancer prevention- advised regular sunscreen use  in [place]- is doing this.   Status of chronic or acute concerns   Insomnia- controlled on xanax 1mg . Multiple failures in past see overview  GERD- controlled on nexium  GAD- controlled on zoloft  OSA on cpap- would encourage compliance in [place]- did not take last time  Essential hypertension S: controlled on amlodipine 5mg .  BP Readings from Last 3 Encounters:  10/13/16 (!) 144/80  08/27/15 138/70  08/22/15 122/73  A/P: from AVS: For blood pressure- goal 150 minutes a week exercise and DASh eating plan as below. If not below 140/90 on home monitoring  within 2 months- we will increase your amlodipine to 10mg   Hyperglycemia Hyperglycemia- update cbg today, weight up 13 lbs- discussed a1c as well   Hyperlipidemia S: poorly controlled on last check- on atorvastatin 10mg  at this point. No myalgias.  Lab Results  Component Value Date   CHOL 225 (H) 08/20/2015   HDL 39.20 08/20/2015   LDLCALC 112 (H) 05/20/2015   LDLDIRECT 160.0 08/20/2015   TRIG 209.0 (H) 08/20/2015   CHOLHDL 6 08/20/2015   A/P: update lipids today  Chronic daily headache Chronic daily HA- has seen Dr. Clarisse GougeLewit in past headache doctor- nsaids through them as well as imipramine. Has had issues with refills while in [place]- Told him I will refill the imipramine and ansaid. 1 more that he will let me know by mychart. Worsening headache with lack of refills- now taking the imipramine every other day.   Would advise check in summer time- agrees to AAA screening when will be around longer  Orders Placed This Encounter  Procedures  . CBC    Standing Status:   Future    Standing Expiration Date:   10/13/2017  . Comprehensive metabolic panel    [facility]    Standing Status:   Future    Standing Expiration Date:   10/13/2017  . Hepatitis C antibody, reflex    [facility]    Standing Status:   Future    Standing Expiration Date:   10/13/2017  . Lipid panel    Standing Status:   Future    Standing Expiration Date:    10/13/2017  . PSA    Standing Status:   Future    Standing Expiration Date:   10/13/2017  . Hemoglobin A1c    [facility]    Standing Status:   Future    Standing Expiration Date:   10/13/2017  . POCT Urinalysis Dipstick (Automated)    Standing Status:   Future    Standing Expiration Date:   10/13/2017    No orders of the defined types were placed in this encounter.   Return precautions advised.   Tana ConchStephen Letrice Pollok, MD

## 2016-10-13 NOTE — Assessment & Plan Note (Signed)
S: controlled on amlodipine 5mg .  BP Readings from Last 3 Encounters:  10/13/16 (!) 144/80  08/27/15 138/70  08/22/15 122/73  A/P: from AVS: For blood pressure- goal 150 minutes a week exercise and DASh eating plan as below. If not below 140/90 on home monitoring within 2 months- we will increase your amlodipine to 10mg 

## 2016-10-13 NOTE — Progress Notes (Signed)
Pre visit review using our clinic review tool, if applicable. No additional management support is needed unless otherwise documented below in the visit note. 

## 2016-10-13 NOTE — Patient Instructions (Addendum)
Prevnar and Flu shot today  Asher MuirJamie- will sign you up for mychart  Schedule a lab visit at the check out desk within 2 weeks. Return for future fasting labs meaning nothing but water after midnight please. Ok to take your medications with water.   For blood pressure- goal 150 minutes a week exercise and DASh eating plan as below. If not below 140/90 on home monitoring within 2 months- we will increase your amlodipine to 10mg   Would advise check in summer time- agrees to AAA screening when will be around longer    DASH Eating Plan DASH stands for "Dietary Approaches to Stop Hypertension." The DASH eating plan is a healthy eating plan that has been shown to reduce high blood pressure (hypertension). Additional health benefits may include reducing the risk of type 2 diabetes mellitus, heart disease, and stroke. The DASH eating plan may also help with weight loss. What do I need to know about the DASH eating plan? For the DASH eating plan, you will follow these general guidelines:  Choose foods with less than 150 milligrams of sodium per serving (as listed on the food label).  Use salt-free seasonings or herbs instead of table salt or sea salt.  Check with your health care provider or pharmacist before using salt substitutes.  Eat lower-sodium products. These are often labeled as "low-sodium" or "no salt added."  Eat fresh foods. Avoid eating a lot of canned foods.  Eat more vegetables, fruits, and low-fat dairy products.  Choose whole grains. Look for the word "whole" as the first word in the ingredient list.  Choose fish and skinless chicken or Mal[place]key more often than red meat. Limit fish, poultry, and meat to 6 oz (170 g) each day.  Limit sweets, desserts, sugars, and sugary drinks.  Choose heart-healthy fats.  Eat more home-cooked food and less restaurant, buffet, and fast food.  Limit fried foods.  Do not fry foods. Cook foods using methods such as baking, boiling, grilling,  and broiling instead.  When eating at a restaurant, ask that your food be prepared with less salt, or no salt if possible. What foods can I eat? Seek help from a dietitian for individual calorie needs. Grains  Whole grain or whole wheat bread. Brown rice. Whole grain or whole wheat pasta. Quinoa, bulgur, and whole grain cereals. Low-sodium cereals. Corn or whole wheat flour tortillas. Whole grain cornbread. Whole grain crackers. Low-sodium crackers. Vegetables  Fresh or frozen vegetables (raw, steamed, roasted, or grilled). Low-sodium or reduced-sodium tomato and vegetable juices. Low-sodium or reduced-sodium tomato sauce and paste. Low-sodium or reduced-sodium canned vegetables. Fruits  All fresh, canned (in natural juice), or frozen fruits. Meat and Other Protein Products  Ground beef (85% or leaner), grass-fed beef, or beef trimmed of fat. Skinless chicken or Mal[place]urkey. Ground chicken or [place]urkey. Pork trimmed of fat. All fish and seafood. Eggs. Dried beans, peas, or lentils. Unsalted nuts and seeds. Unsalted canned beans. Dairy  Low-fat dairy products, such as skim or 1% milk, 2% or reduced-fat cheeses, low-fat ricotta or cottage cheese, or plain low-fat yogurt. Low-sodium or reduced-sodium cheeses. Fats and Oils  Tub margarines without trans fats. Light or reduced-fat mayonnaise and salad dressings (reduced sodium). Avocado. Safflower, olive, or canola oils. Natural peanut or almond butter. Other  Unsalted popcorn and pretzels. The items listed above may not be a complete list of recommended foods or beverages. Contact your dietitian for more options.  What foods are not recommended? Grains  White bread. White pasta. White  rice. Refined cornbread. Bagels and croissants. Crackers that contain trans fat. Vegetables  Creamed or fried vegetables. Vegetables in a cheese sauce. Regular canned vegetables. Regular canned tomato sauce and paste. Regular tomato and vegetable juices. Fruits  Canned  fruit in light or heavy syrup. Fruit juice. Meat and Other Protein Products  Fatty cuts of meat. Ribs, chicken wings, bacon, sausage, bologna, salami, chitterlings, fatback, hot dogs, bratwurst, and packaged luncheon meats. Salted nuts and seeds. Canned beans with salt. Dairy  Whole or 2% milk, cream, half-and-half, and cream cheese. Whole-fat or sweetened yogurt. Full-fat cheeses or blue cheese. Nondairy creamers and whipped toppings. Processed cheese, cheese spreads, or cheese curds. Condiments  Onion and garlic salt, seasoned salt, table salt, and sea salt. Canned and packaged gravies. Worcestershire sauce. Tartar sauce. Barbecue sauce. Teriyaki sauce. Soy sauce, including reduced sodium. Steak sauce. Fish sauce. Oyster sauce. Cocktail sauce. Horseradish. Ketchup and mustard. Meat flavorings and tenderizers. Bouillon cubes. Hot sauce. Tabasco sauce. Marinades. Taco seasonings. Relishes. Fats and Oils  Butter, stick margarine, lard, shortening, ghee, and bacon fat. Coconut, palm kernel, or palm oils. Regular salad dressings. Other  Pickles and olives. Salted popcorn and pretzels. The items listed above may not be a complete list of foods and beverages to avoid. Contact your dietitian for more information.  Where can I find more information? National Heart, Lung, and Blood Institute: CablePromo.itwww.nhlbi.nih.gov/health/health-topics/topics/dash/ This information is not intended to replace advice given to you by your health care provider. Make sure you discuss any questions you have with your health care provider. Document Released: 10/01/2011 Document Revised: 03/19/2016 Document Reviewed: 08/16/2013 Elsevier Interactive Patient Education  2017 [employer]

## 2016-10-14 ENCOUNTER — Other Ambulatory Visit: Payer: Self-pay | Admitting: Family Medicine

## 2016-10-14 LAB — PSA: PSA: 0.88 ng/mL (ref 0.10–4.00)

## 2016-10-14 LAB — COMPREHENSIVE METABOLIC PANEL
ALK PHOS: 48 U/L (ref 39–117)
ALT: 32 U/L (ref 0–53)
AST: 26 U/L (ref 0–37)
Albumin: 4.8 g/dL (ref 3.5–5.2)
BUN: 15 mg/dL (ref 6–23)
CO2: 28 meq/L (ref 19–32)
Calcium: 9.8 mg/dL (ref 8.4–10.5)
Chloride: 102 mEq/L (ref 96–112)
Creatinine, Ser: 0.94 mg/dL (ref 0.40–1.50)
GFR: 85.35 mL/min (ref >60.00)
GLUCOSE: 96 mg/dL (ref 70–99)
POTASSIUM: 4.6 meq/L (ref 3.5–5.1)
Sodium: 138 mEq/L (ref 135–145)
TOTAL PROTEIN: 7.3 g/dL (ref 6.0–8.3)
Total Bilirubin: 0.9 mg/dL (ref 0.2–1.2)

## 2016-10-14 LAB — CBC
HEMATOCRIT: 42.3 % (ref 39.0–52.0)
HEMOGLOBIN: 14.7 g/dL (ref 13.0–17.0)
MCHC: 34.7 g/dL (ref 30.0–36.0)
MCV: 90.4 fl (ref 78.0–100.0)
Platelets: 190 10*3/uL (ref 150.0–400.0)
RBC: 4.68 Mil/uL (ref 4.22–5.81)
RDW: 13 % (ref 11.5–15.5)
WBC: 6.8 10*3/uL (ref 4.0–10.5)

## 2016-10-14 LAB — POC URINALSYSI DIPSTICK (AUTOMATED)
BILIRUBIN UA: NEGATIVE
GLUCOSE UA: NEGATIVE
KETONES UA: NEGATIVE
LEUKOCYTES UA: NEGATIVE
Nitrite, UA: NEGATIVE
PH UA: 5.5
Spec Grav, UA: 1.02
Urobilinogen, UA: 1

## 2016-10-14 LAB — LIPID PANEL
Cholesterol: 168 mg/dL (ref 0–200)
HDL: 31.4 mg/dL — AB (ref >39.00)
LDL Cholesterol: 102 mg/dL — ABNORMAL HIGH (ref 0–99)
NONHDL: 136.15
Total CHOL/HDL Ratio: 5
Triglycerides: 170 mg/dL — ABNORMAL HIGH (ref 0.0–149.0)
VLDL: 34 mg/dL (ref 0.0–40.0)

## 2016-10-14 LAB — URINALYSIS, MICROSCOPIC ONLY

## 2016-10-14 LAB — HEMOGLOBIN A1C: Hgb A1c MFr Bld: 5.4 % (ref 4.6–6.5)

## 2016-10-14 NOTE — Addendum Note (Signed)
Addended by: Bonnye FavaKWEI, Ameka Krigbaum K on: 10/14/2016 12:40 PM   Modules accepted: Orders

## 2016-10-14 NOTE — Addendum Note (Signed)
Addended by: Rossie MuskratKWEI, NANA K on: 10/14/2016 11:00 AM   Modules accepted: Orders

## 2016-10-15 LAB — HEPATITIS C ANTIBODY: HCV AB: NEGATIVE

## 2016-10-16 ENCOUNTER — Telehealth: Payer: Self-pay | Admitting: Acute Care

## 2016-10-16 NOTE — Telephone Encounter (Signed)
I have called Mr. [name] with the results of his low-dose screening CT. I explained to him that his scan was read as a Lung RADS 2: nodules that are benign in appearance and behavior with a very low likelihood of becoming a clinically active cancer due to size or lack of growth. Mr. [name] confirmed that he quit smoking in 2002. As he has now been smoke free for 15 years he no longer qualifies for the lung cancer screening program per CMS guidelines. I have faxed a copy of his most recent scan to his primary care physician and I have asked him to continue to follow the patient as he feels is appropriate based on the fact that the patient can no longer be followed to the program. I have also explained this to the patient, who verbalized understanding and stated that he would speak to his primary care physician about appropriate follow-up in the future. He verbalized understanding of the above and had no questions or concerns at completion of the call

## 2017-01-05 ENCOUNTER — Encounter: Payer: Self-pay | Admitting: Family Medicine

## 2017-01-05 ENCOUNTER — Other Ambulatory Visit: Payer: Self-pay | Admitting: Family Medicine

## 2017-01-05 MED ORDER — ALPRAZOLAM 1 MG PO TABS: 1.0000 mg | ORAL_TABLET | Freq: Every evening | ORAL | 1 refills | Status: DC | PRN

## 2017-01-05 MED ORDER — IMIPRAMINE HCL 10 MG PO TABS: 50.0000 mg | ORAL_TABLET | Freq: Every day | ORAL | 1 refills | Status: DC

## 2017-01-05 MED ORDER — FLURBIPROFEN 100 MG PO TABS: ORAL_TABLET | ORAL | 5 refills | Status: DC

## 2017-01-05 MED ORDER — VALACYCLOVIR HCL 1 G PO TABS: 1000.0000 mg | ORAL_TABLET | Freq: Two times a day (BID) | ORAL | 3 refills | Status: DC

## 2017-01-05 MED ORDER — SERTRALINE HCL 100 MG PO TABS: 150.0000 mg | ORAL_TABLET | Freq: Every day | ORAL | 3 refills | Status: DC

## 2017-01-05 MED ORDER — ESOMEPRAZOLE MAGNESIUM 40 MG PO CPDR: 40.0000 mg | DELAYED_RELEASE_CAPSULE | Freq: Every day | ORAL | 1 refills | Status: DC | PRN

## 2017-01-05 MED ORDER — AMLODIPINE BESYLATE 5 MG PO TABS: 5.0000 mg | ORAL_TABLET | Freq: Every day | ORAL | 1 refills | Status: DC

## 2017-01-05 MED ORDER — ATORVASTATIN CALCIUM 10 MG PO TABS: 10.0000 mg | ORAL_TABLET | Freq: Every day | ORAL | 1 refills | Status: DC

## 2017-01-07 ENCOUNTER — Other Ambulatory Visit: Payer: Self-pay

## 2017-01-07 MED ORDER — ESOMEPRAZOLE MAGNESIUM 40 MG PO CPDR: 40.0000 mg | DELAYED_RELEASE_CAPSULE | Freq: Every day | ORAL | 1 refills | Status: AC | PRN

## 2017-02-06 IMAGING — CT CT CHEST LUNG CANCER SCREENING LOW DOSE W/O CM
2 of 4 series · 15 of 40 positions shown, 18 images · non-contrast
Comparison: 09/04/2015 screening chest CT.

CLINICAL DATA: 65-year-old asymptomatic male former smoker with 30
pack-year smoking history. Quit smoking in 0770.

EXAM:
CT CHEST WITHOUT CONTRAST LOW-DOSE FOR LUNG CANCER SCREENING
TECHNIQUE: Multidetector CT imaging of the chest was performed following the
standard protocol without IV contrast.

[Series 2: thorax 5.0 i31f 3 · axial · 0.85mm/px · z∈[-325,-35]mm · 12 of 67 slices shown, 15 images]
[im 6/67  mediastinal]
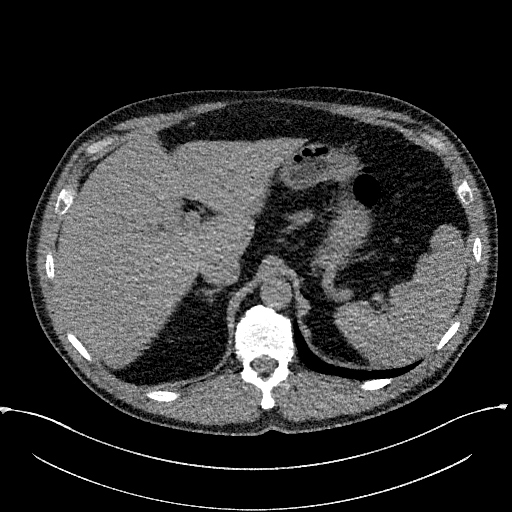
[im 6/67  lung]
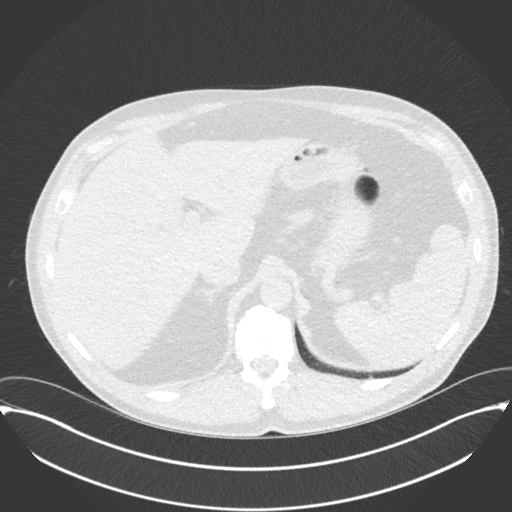
[im 11/67  lung]
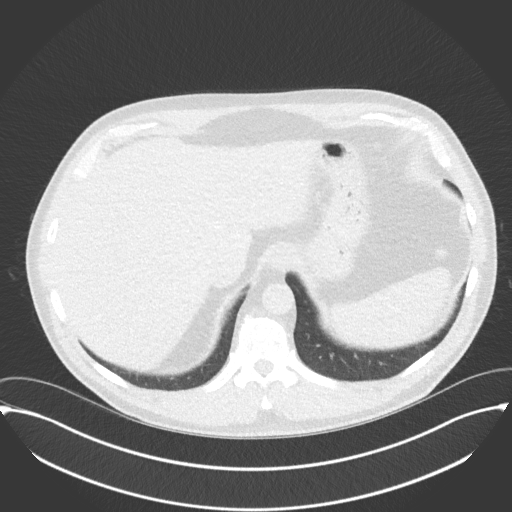
[im 16/67  lung]
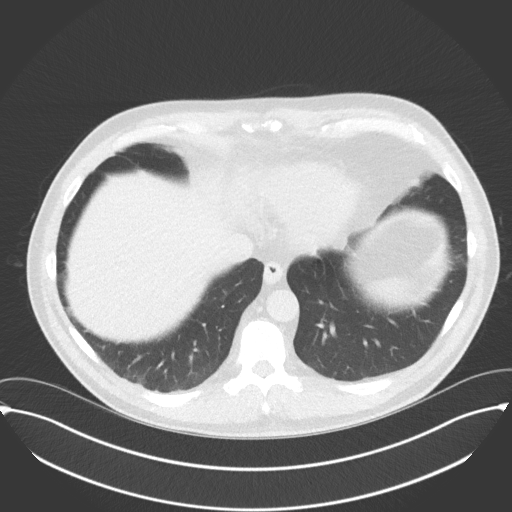
[im 22/67  lung]
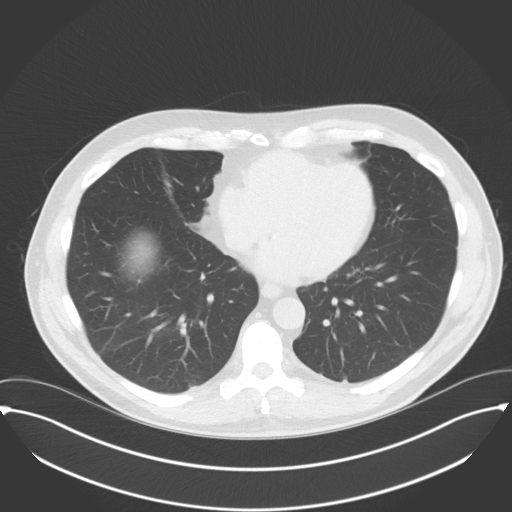
[im 27/67  mediastinal]
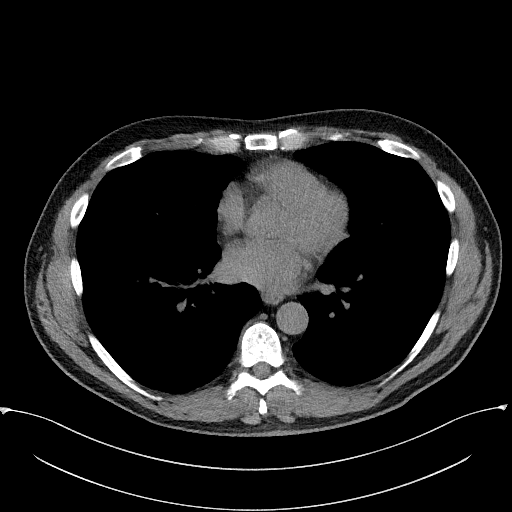
[im 27/67  lung]
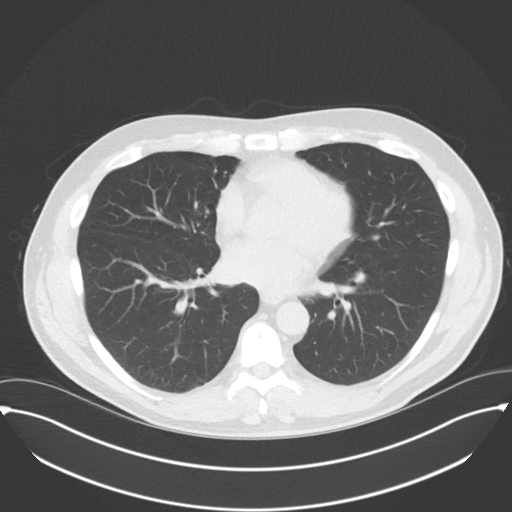
[im 32/67  lung]
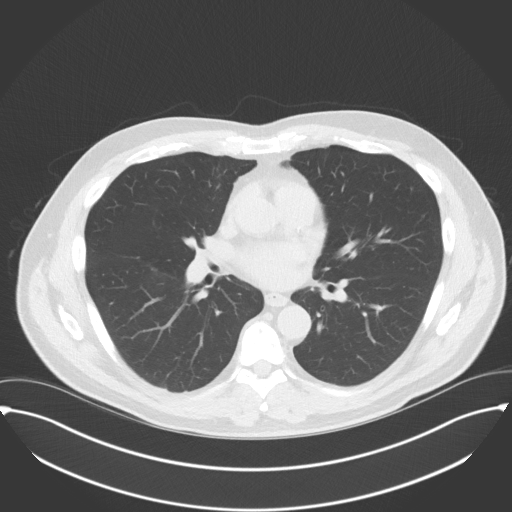
[im 37/67  lung]
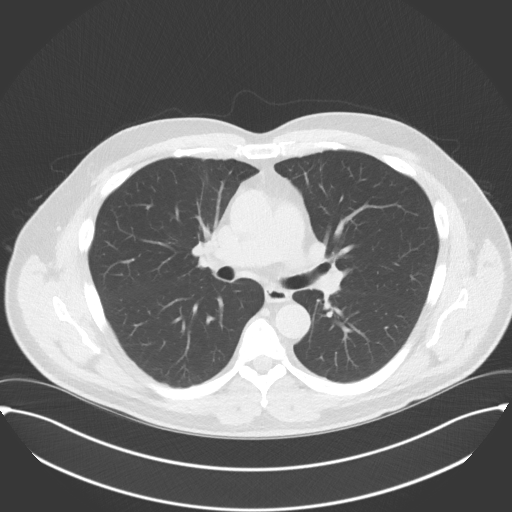
[im 43/67  lung]
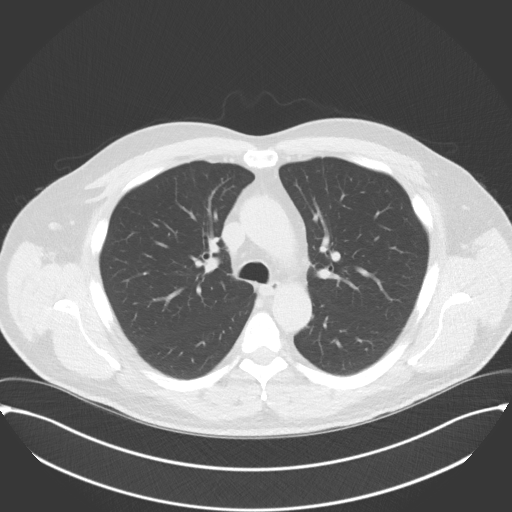
[im 48/67  mediastinal]
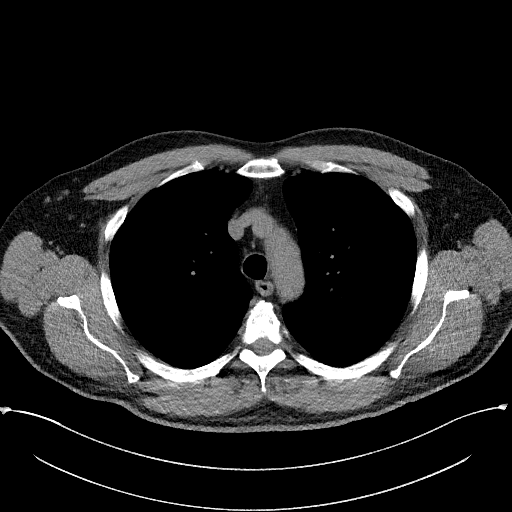
[im 48/67  lung]
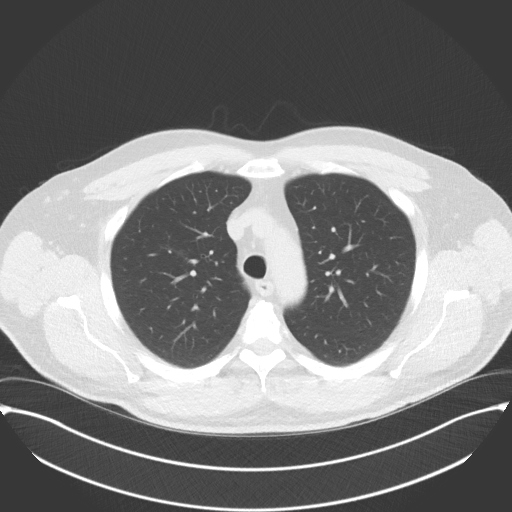
[im 53/67  lung]
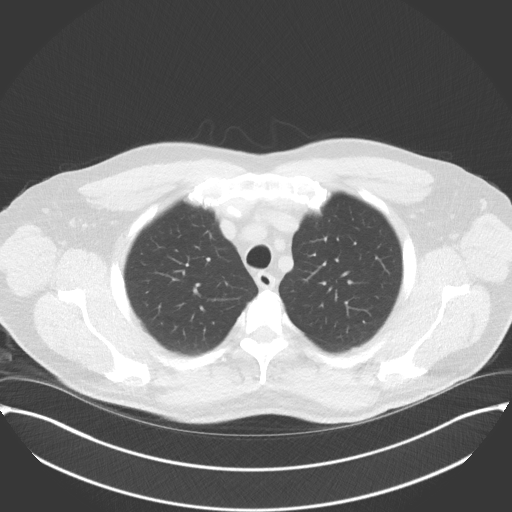
[im 59/67  lung]
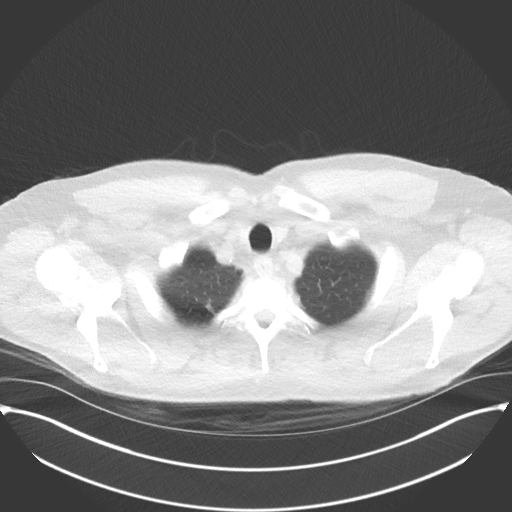
[im 64/67  lung]
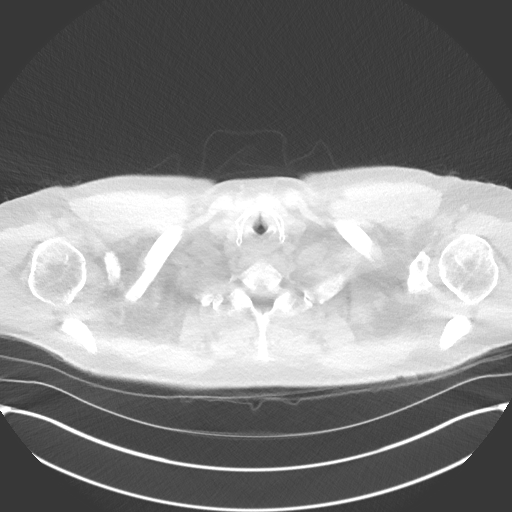

[Series 5: coronal · coronal · 0.67mm/px · 3 of 129 slices shown]
[im 26/129  lung]
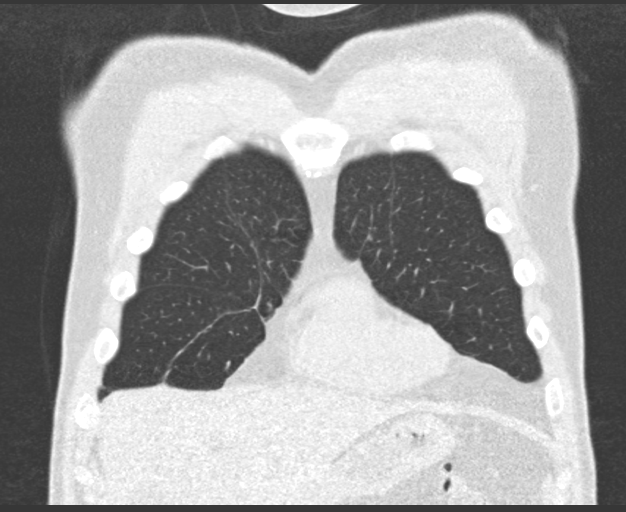
[im 52/129  lung]
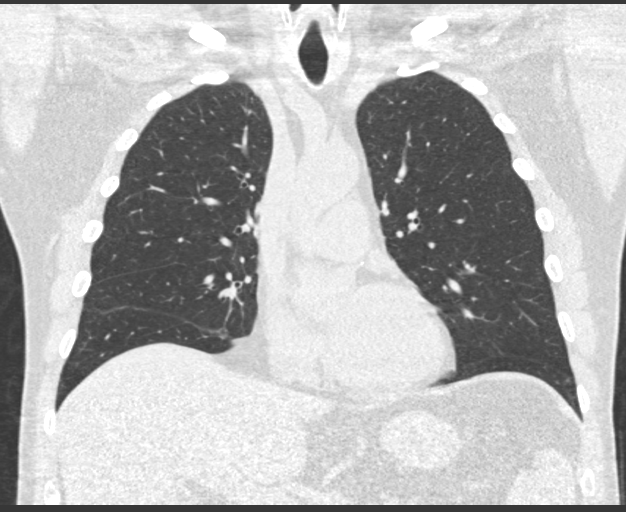
[im 77/129  lung]
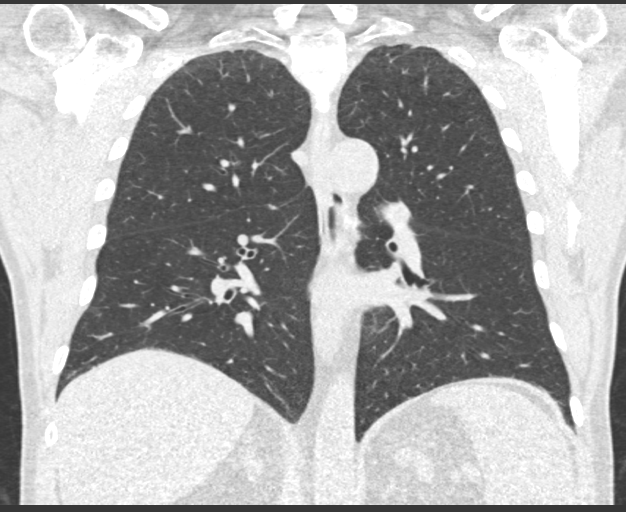

[15 of 40 positions shown; findings below may reference images not displayed]

FINDINGS: Cardiovascular: Normal heart size. No significant pericardial
fluid/thickening. Great vessels are normal in course and caliber.

Mediastinum/Nodes: No discrete thyroid nodules. Unremarkable
esophagus. No pathologically enlarged axillary, mediastinal or gross
hilar lymph nodes, noting limited sensitivity for the detection of
hilar adenopathy on this noncontrast study.

Lungs/Pleura: No pneumothorax. No pleural effusion. Solitary right
upper lobe pulmonary nodule measures 5.2 mm in volume derived mean
diameter (series 3/ image 41), not appreciably changed. No acute
consolidative airspace disease, lung masses or new significant
pulmonary nodules. Stable small parenchymal band at the anterior
left lower lobe base consistent with mild postinfectious/
postinflammatory scarring.

Upper abdomen: Unremarkable.

Musculoskeletal: No aggressive appearing focal osseous lesions. Mild
thoracic spondylosis .
IMPRESSION: Lung-RADS Category 2, benign appearance or behavior. Continue annual
screening with low-dose chest CT without contrast in 12 months.

## 2017-02-09 ENCOUNTER — Encounter: Payer: Self-pay | Admitting: Family Medicine

## 2017-02-10 ENCOUNTER — Telehealth: Payer: Self-pay | Admitting: Family Medicine

## 2017-02-10 NOTE — Telephone Encounter (Signed)
The patient needs his Rx sent to:  [id number] - [place], [place] - [address] AT [facility] [address]  Instead of [place] because he decided to come to [facility] tomorrow

## 2017-02-12 NOTE — Telephone Encounter (Signed)
Called patient and spoke with him. He is calling his wife to get the fax number so we can fax him the release form.

## 2017-02-16 MED ORDER — ISOMETHEPTENE-DICHLORAL-APAP 65-100-325 MG PO CAPS: 1.0000 | ORAL_CAPSULE | Freq: Four times a day (QID) | ORAL | 0 refills | Status: DC | PRN

## 2017-02-16 NOTE — Telephone Encounter (Signed)
Per Dr. Glade Stanford office pt has not been seen since 09/2015 and pt has not had this medication refilled since 03/26/2016. Pt is due for an OV with Dr. Vela Prose before his medication can be refilled. Pt was scheduled to see Dr. Vela Prose in June but has canceled.   Dr. Durene Cal - Please advise. Thanks!

## 2017-02-17 NOTE — Telephone Encounter (Signed)
Prescription faxed to pharmacy this morning.

## 2017-02-18 ENCOUNTER — Encounter: Payer: Self-pay | Admitting: Family Medicine

## 2017-02-18 ENCOUNTER — Telehealth: Payer: Self-pay

## 2017-02-18 NOTE — Telephone Encounter (Signed)
Received a call from the pharmacy that states that isometheptene-acetaminophen-dichloralphenazone (MIDRIN) 65-100-325 MG capsule is on national back order. They wanted to know if you want to send in an alternative? They aren't sure when they will be able to get more in.

## 2017-02-18 NOTE — Telephone Encounter (Signed)
I sent him a my chart message.

## 2017-06-16 ENCOUNTER — Other Ambulatory Visit: Payer: Self-pay

## 2017-06-16 MED ORDER — FLURBIPROFEN 100 MG PO TABS: ORAL_TABLET | ORAL | 5 refills | Status: DC

## 2017-06-16 MED ORDER — IMIPRAMINE HCL 10 MG PO TABS: 50.0000 mg | ORAL_TABLET | Freq: Every day | ORAL | 1 refills | Status: DC

## 2017-09-17 ENCOUNTER — Other Ambulatory Visit: Payer: Self-pay | Admitting: Family Medicine

## 2017-09-20 ENCOUNTER — Telehealth: Payer: Self-pay | Admitting: Family Medicine

## 2017-09-20 NOTE — Telephone Encounter (Signed)
Rx request 

## 2017-09-20 NOTE — Telephone Encounter (Signed)
Copied from CRM [phone] [id number]. Topic: Inquiry >> Sep 20, 2017  8:26 AM Yvonna Alanisobinson, Andra M wrote: Reason for CRM: Patient called requesting a refill of  ALPRAZolam (XANAX) 1 MG tablet. Patient wants prescription sent to . Please call patient ASAP with Refill details. [id number] [place] - [address] [facility] [address] [facility] [phone] (Phone), [phone] (Fax). Patient leaving for a trip tomorrow, and needs medication ASAP today. Please call patient once prescription sent to pharmacy.

## 2017-09-21 NOTE — Telephone Encounter (Signed)
May give #20 but please advise patient that he needs to be seen before any more refills

## 2017-09-22 NOTE — Telephone Encounter (Signed)
Faxed to pharmacy

## 2017-10-22 ENCOUNTER — Other Ambulatory Visit: Payer: Self-pay | Admitting: Family Medicine

## 2017-10-22 ENCOUNTER — Telehealth: Payer: Self-pay | Admitting: Family Medicine

## 2017-10-22 NOTE — Telephone Encounter (Signed)
Copied from CRM [id number]. Topic: Quick Communication - See Telephone Encounter >> Oct 22, 2017  3:17 PM Terisa Starraylor, Brittany L wrote: CRM for notification. See Telephone encounter for:   10/22/17.  Pt [place] [facility] [place] On file. Pt will be seen for a CPE in Feb

## 2017-10-22 NOTE — Telephone Encounter (Signed)
Please advise 

## 2017-10-22 NOTE — Telephone Encounter (Signed)
Controlled substance 

## 2017-10-25 MED ORDER — ALPRAZOLAM 1 MG PO TABS: ORAL_TABLET | ORAL | 0 refills | Status: DC

## 2017-10-25 NOTE — Telephone Encounter (Signed)
E prescribed. Please inform patient. Will only provide further refills in person which should be fine for his physical in February.

## 2017-10-25 NOTE — Addendum Note (Signed)
Addended by: Shelva MajesticHUNTER, Braedyn O on: 10/25/2017 04:37 PM   Modules accepted: Orders

## 2017-10-27 NOTE — Telephone Encounter (Signed)
Tried both the home number listed (no voicemail has been set up) and the mobile number listed (no longer in service)

## 2017-11-24 ENCOUNTER — Encounter: Payer: Self-pay | Admitting: Family Medicine

## 2017-11-25 ENCOUNTER — Other Ambulatory Visit: Payer: Self-pay

## 2017-11-25 MED ORDER — AMLODIPINE BESYLATE 5 MG PO TABS: 5.0000 mg | ORAL_TABLET | Freq: Every day | ORAL | 1 refills | Status: DC

## 2017-11-25 MED ORDER — ISOMETHEPTENE-DICHLORAL-APAP 65-100-325 MG PO CAPS: 1.0000 | ORAL_CAPSULE | Freq: Four times a day (QID) | ORAL | 0 refills | Status: DC | PRN

## 2017-11-25 MED ORDER — IMIPRAMINE HCL 10 MG PO TABS: 50.0000 mg | ORAL_TABLET | Freq: Every day | ORAL | 1 refills | Status: DC

## 2017-11-25 MED ORDER — FLURBIPROFEN 100 MG PO TABS: ORAL_TABLET | ORAL | 5 refills | Status: DC

## 2017-11-26 ENCOUNTER — Encounter: Payer: BLUE CROSS/BLUE SHIELD | Admitting: Family Medicine

## 2017-11-27 DIAGNOSIS — Z23 Encounter for immunization: Secondary | ICD-10-CM | POA: Diagnosis not present

## 2017-12-15 ENCOUNTER — Encounter: Payer: Self-pay | Admitting: Family Medicine

## 2017-12-15 ENCOUNTER — Ambulatory Visit (INDEPENDENT_AMBULATORY_CARE_PROVIDER_SITE_OTHER): Payer: Medicare Other | Admitting: Family Medicine

## 2017-12-15 VITALS — BP 133/82 | HR 75 | Temp 98.0°F | Ht 71.5 in | Wt 216.8 lb

## 2017-12-15 DIAGNOSIS — F411 Generalized anxiety disorder: Secondary | ICD-10-CM | POA: Diagnosis not present

## 2017-12-15 DIAGNOSIS — E785 Hyperlipidemia, unspecified: Secondary | ICD-10-CM

## 2017-12-15 DIAGNOSIS — Z0000 Encounter for general adult medical examination without abnormal findings: Secondary | ICD-10-CM | POA: Diagnosis not present

## 2017-12-15 DIAGNOSIS — G4700 Insomnia, unspecified: Secondary | ICD-10-CM

## 2017-12-15 DIAGNOSIS — Z87891 Personal history of nicotine dependence: Secondary | ICD-10-CM | POA: Insufficient documentation

## 2017-12-15 DIAGNOSIS — R3911 Hesitancy of micturition: Secondary | ICD-10-CM

## 2017-12-15 DIAGNOSIS — R739 Hyperglycemia, unspecified: Secondary | ICD-10-CM

## 2017-12-15 DIAGNOSIS — Z23 Encounter for immunization: Secondary | ICD-10-CM

## 2017-12-15 DIAGNOSIS — I10 Essential (primary) hypertension: Secondary | ICD-10-CM | POA: Diagnosis not present

## 2017-12-15 DIAGNOSIS — R82998 Other abnormal findings in urine: Secondary | ICD-10-CM

## 2017-12-15 DIAGNOSIS — R519 Headache, unspecified: Secondary | ICD-10-CM

## 2017-12-15 DIAGNOSIS — R51 Headache: Secondary | ICD-10-CM

## 2017-12-15 DIAGNOSIS — K219 Gastro-esophageal reflux disease without esophagitis: Secondary | ICD-10-CM

## 2017-12-15 DIAGNOSIS — R203 Hyperesthesia: Secondary | ICD-10-CM

## 2017-12-15 DIAGNOSIS — G4733 Obstructive sleep apnea (adult) (pediatric): Secondary | ICD-10-CM

## 2017-12-15 DIAGNOSIS — A6000 Herpesviral infection of urogenital system, unspecified: Secondary | ICD-10-CM

## 2017-12-15 LAB — COMPREHENSIVE METABOLIC PANEL
ALBUMIN: 4.4 g/dL (ref 3.5–5.2)
ALT: 22 U/L (ref 0–53)
AST: 30 U/L (ref 0–37)
Alkaline Phosphatase: 62 U/L (ref 39–117)
BUN: 15 mg/dL (ref 6–23)
CALCIUM: 10.3 mg/dL (ref 8.4–10.5)
CO2: 30 meq/L (ref 19–32)
CREATININE: 0.81 mg/dL (ref 0.40–1.50)
Chloride: 98 mEq/L (ref 96–112)
GFR: 100.99 mL/min (ref >60.00)
Glucose, Bld: 105 mg/dL — ABNORMAL HIGH (ref 70–99)
POTASSIUM: 4.5 meq/L (ref 3.5–5.1)
SODIUM: 135 meq/L (ref 135–145)
Total Bilirubin: 0.8 mg/dL (ref 0.2–1.2)
Total Protein: 7.3 g/dL (ref 6.0–8.3)

## 2017-12-15 LAB — POC URINALSYSI DIPSTICK (AUTOMATED)
Blood, UA: NEGATIVE
Glucose, UA: NEGATIVE
Nitrite, UA: NEGATIVE
PH UA: 6 (ref 5.0–8.0)
PROTEIN UA: 15
SPEC GRAV UA: 1.025 (ref 1.010–1.025)
Urobilinogen, UA: 1 E.U./dL

## 2017-12-15 LAB — PSA: PSA: 1.4 ng/mL (ref 0.10–4.00)

## 2017-12-15 LAB — CBC
HEMATOCRIT: 42.5 % (ref 39.0–52.0)
Hemoglobin: 14.7 g/dL (ref 13.0–17.0)
MCHC: 34.5 g/dL (ref 30.0–36.0)
MCV: 86.8 fl (ref 78.0–100.0)
PLATELETS: 188 10*3/uL (ref 150.0–400.0)
RBC: 4.89 Mil/uL (ref 4.22–5.81)
RDW: 14.3 % (ref 11.5–15.5)
WBC: 7.7 10*3/uL (ref 4.0–10.5)

## 2017-12-15 LAB — LIPID PANEL
CHOLESTEROL: 163 mg/dL (ref 0–200)
HDL: 31.8 mg/dL — ABNORMAL LOW (ref >39.00)
Total CHOL/HDL Ratio: 5

## 2017-12-15 LAB — LDL CHOLESTEROL, DIRECT: Direct LDL: 96 mg/dL

## 2017-12-15 LAB — HEMOGLOBIN A1C: HEMOGLOBIN A1C: 6 % (ref 4.6–6.5)

## 2017-12-15 MED ORDER — AMLODIPINE BESYLATE 10 MG PO TABS: 10.0000 mg | ORAL_TABLET | Freq: Every day | ORAL | 1 refills | Status: DC

## 2017-12-15 MED ORDER — IMIPRAMINE HCL 50 MG PO TABS: 50.0000 mg | ORAL_TABLET | Freq: Every day | ORAL | 1 refills | Status: DC

## 2017-12-15 MED ORDER — FLURBIPROFEN 100 MG PO TABS: ORAL_TABLET | ORAL | 5 refills | Status: DC

## 2017-12-15 MED ORDER — TRIAMCINOLONE ACETONIDE 0.1 % EX CREA: 1.0000 "application " | TOPICAL_CREAM | Freq: Two times a day (BID) | CUTANEOUS | 1 refills | Status: DC

## 2017-12-15 MED ORDER — ALPRAZOLAM 1 MG PO TABS: ORAL_TABLET | ORAL | 0 refills | Status: DC

## 2017-12-15 MED ORDER — ATORVASTATIN CALCIUM 10 MG PO TABS: 10.0000 mg | ORAL_TABLET | Freq: Every day | ORAL | 1 refills | Status: DC

## 2017-12-15 MED ORDER — ISOMETHEPTENE-DICHLORAL-APAP 65-100-325 MG PO CAPS: ORAL_CAPSULE | ORAL | 5 refills | Status: DC

## 2017-12-15 NOTE — Assessment & Plan Note (Signed)
Hyperlipidemia- mild poor control on atorvastatin 10mg  in past. Advised weight loss-  He is down 4 lbs from that time Wt Readings from Last 3 Encounters:  12/15/17 216 lb 12.8 oz (98.3 kg)  10/13/16 219 lb 3.2 oz (99.4 kg)  08/27/15 205 lb (93 kg)

## 2017-12-15 NOTE — Assessment & Plan Note (Signed)
OSA on cpap- advised compliance, he is doing well with this

## 2017-12-15 NOTE — Progress Notes (Signed)
Phone: [phone]  Subjective:  Patient presents today for their Welcome to Medicare Exam    Preventive Screening-Counseling & Management  Vision screen:   Visual Acuity Screening   Right eye Left eye Both eyes  Without correction: 20/30 20/30 20/25   With correction:       Advanced directives: None in place. Has a will but not a living will or HCPOA. Full code- would not want prolonged life support.   Smoking Status: former Smoker 28 pack years. Quit 1998.  Second Hand Smoking status: No smokers in home  Risk Factors Regular exercise: 3x a week weight lifting/machines Diet: has lost some weight- down 3 lbs from last year but discussed dietary measures to get this down further- had been 205 back in 2016 and up 11 lbs still from that   Fall Risk: None  Fall Risk  12/15/2017 10/13/2016  Falls in the past year? No No   Opioid use history:  no long term opioids use  Cardiac risk factors:  advanced age (older than 62 for men, 74 for women)  Hyperlipidemia - yes and mild poor control last check HTN- poor control initially No diabetes. At risk for diabetes- have discussed reducing risk Family History:  mother with MI at age 41- was heavy former smoker  Depression Screen None. PHQ2 0  Depression screen [facility] 2/9 12/15/2017 12/15/2017 10/13/2016  Decreased Interest 0 0 0  Down, Depressed, Hopeless 0 0 0  PHQ - 2 Score 0 0 0  Altered sleeping 3 - -  Tired, decreased energy 0 - -  Change in appetite 0 - -  Feeling bad or failure about yourself  0 - -  Trouble concentrating 0 - -  Moving slowly or fidgety/restless 0 - -  Suicidal thoughts 0 - -  PHQ-9 Score 3 - -  Difficult doing work/chores Very difficult - -    Activities of Daily Living Independent ADLs and IADLs   Hearing Difficulties: -patient declines major issues. Minor issues with whispered voices - declines need for hearing aids  Cognitive Testing No reported trouble.    Normal 3 word recall  List  the Names of Other Physician/Practitioners you currently use: -none reported - has seen optho in past- no recent visit  Screening tests- up to date 1. Anticipatory guidance: Patient counseled regarding regular dental exams - advised q6 months (gets most of dental work done in [place]), eye exams - no recent exam- advised to get one updated, wearing seatbelts.  2. Risk factor reduction:  Advised patient of need for regular exercise and diet rich and fruits and vegetables to reduce risk of heart attack and stroke. exercisign 3x a week. Discussed pushing with diet getting closer to 200 Wt Readings from Last 3 Encounters:  12/15/17 216 lb 12.8 oz (98.3 kg)  10/13/16 219 lb 3.2 oz (99.4 kg)  08/27/15 205 lb (93 kg)   3. Immunizations/screenings/ancillary studies: discussed shingrix availability issues, otherwise up to date Immunization History  Administered Date(s) Administered  . Influenza Split 10/24/2012  . Influenza, High Dose Seasonal PF 10/13/2016  . Influenza,inj,Quad PF,6+ Mos 07/18/2013, 08/27/2015  . Influenza-Unspecified 11/29/2017  . Pneumococcal Conjugate-13 10/13/2016  . Pneumococcal Polysaccharide-23 12/15/2017  . Td 04/11/2009  . Tdap 10/21/2016  4. Prostate cancer screening- will get PSA with urinary hesitancy. Rectal exam - low risk  Lab Results  Component Value Date   PSA 0.88 10/14/2016   PSA 1.35 05/20/2015   PSA 0.67 10/07/2010   5. Colon cancer screening - 08/22/15  with 10 year repeat. Never had polyps 6. Skin cancer screening- no dermatologist. advised regular sunscreen use. No concerning skin areas other than rash behind knee intermittent and general sensitive skin  ROS- No pertinent positives discovered in course of AWV  Also pertinent ROS- No chest pain or shortness of breath. No blurry vision.  Does have headaches  The following were reviewed and entered/updated in epic: Past Medical History:  Diagnosis Date  . Allergy   . ANXIETY 06/15/2007  . GERD  07/08/2007  . HEMATOCHEZIA 02/15/2009  . HERNIA, UMBILICAL 08/01/2007  . HYPERLIPIDEMIA 04/11/2009  . HYPERTENSION 07/08/2007  . OBSTRUCTIVE SLEEP APNEA 07/08/2007  . Sleep apnea    uses cpap   Patient Active Problem List   Diagnosis Date Noted  . Hyperglycemia 08/27/2015    Priority: Medium  . Chronic daily headache 01/10/2015    Priority: Medium  . Insomnia 01/10/2015    Priority: Medium  . Hyperlipidemia 04/11/2009    Priority: Medium  . OBSTRUCTIVE SLEEP APNEA 07/08/2007    Priority: Medium  . Essential hypertension 07/08/2007    Priority: Medium  . GAD (generalized anxiety disorder) 06/15/2007    Priority: Medium  . Genital herpes 01/10/2015    Priority: Low  . GERD 07/08/2007    Priority: Low  . Former smoker 12/15/2017  . Sensitive skin 12/15/2017   Past Surgical History:  Procedure Laterality Date  . bone spur removal foot     pretty regular numbness after surgery  . COLONOSCOPY  12-19-2004  . EYE SURGERY     lasik  . HAIR TRANSPLANT    . TONSILLECTOMY AND ADENOIDECTOMY    . UMBILICAL HERNIA REPAIR  2010    Family History  Problem Relation Age of Onset  . Cancer Mother        esophageal, former smoker  . Heart disease Mother [id number]       MI, former smoker  . Esophageal cancer Mother   . ALS Father   . Lung cancer Brother        smoker  . Multiple sclerosis Brother   . Colon cancer Neg Hx   . Colon polyps Neg Hx   . Rectal cancer Neg Hx   . Stomach cancer Neg Hx     Medications- reviewed and updated Current Outpatient Medications  Medication Sig Dispense Refill  . ALPRAZolam (XANAX) 1 MG tablet TAKE 1 TABLET BY MOUTH EVERY NIGHT AT BEDTIME AS NEEDED FOR ANXIETY 90 tablet 0  . amLODipine (NORVASC) 10 MG tablet Take 1 tablet (10 mg total) by mouth daily. 90 tablet 1  . atorvastatin (LIPITOR) 10 MG tablet Take 1 tablet (10 mg total) by mouth daily. 90 tablet 1  . esomeprazole (NEXIUM) 40 MG capsule Take 1 capsule (40 mg total) by mouth daily as needed. 90  capsule 1  . flurbiprofen (ANSAID) 100 MG tablet take 1 tablet by mouth twice a day if needed for headache NO MORE THAN 4 DAYS PER WEEK 16 tablet 5  . imipramine (TOFRANIL) 50 MG tablet Take 1 tablet (50 mg total) by mouth daily. 90 tablet 1  . isometheptene-acetaminophen-dichloralphenazone (MIDRIN) 65-100-325 MG capsule May take once an hour as needed (max 5 per day) for migraines. Max twice per week 15 capsule 5  . sertraline (ZOLOFT) 100 MG tablet Take 1.5 tablets (150 mg total) by mouth daily. 135 tablet 3  . valACYclovir (VALTREX) 1000 MG tablet Take 1 tablet (1,000 mg total) by mouth 2 (two) times daily. 180 tablet 3  .  triamcinolone cream (KENALOG) 0.1 % Apply 1 application topically 2 (two) times daily. For 7-10 days maximum 80 g 1   No current facility-administered medications for this visit.     Allergies-reviewed and updated Allergies  Allergen Reactions  . Horse-Derived Products     REACTION: rash    Social History   Socioeconomic History  . Marital status: Married    Spouse name: None  . Number of children: None  . Years of education: None  . Highest education level: None  Social Needs  . Financial resource strain: None  . Food insecurity - worry: None  . Food insecurity - inability: None  . Transportation needs - medical: None  . Transportation needs - non-medical: None  Occupational History  . Occupation: retired  Tobacco Use  . Smoking status: Former Smoker    Packs/day: 1.50    Years: 20.00    Pack years: 30.00    Types: Cigarettes    Last attempt to quit: 01/26/2001    Years since quitting: 16.8  . Smokeless tobacco: Never Used  . Tobacco comment: Encouraged to remain smoke free  Substance and Sexual Activity  . Alcohol use: No    Alcohol/week: 0.0 oz    Comment: alcoholic. Sparing drinks currently-once a month or less  . Drug use: No  . Sexual activity: None  Other Topics Concern  . None  Social History Narrative   Married (wife patient outside  Financial risk analyst). 2 daughters. 2 grandkids-1 boy and 1 girl.       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       Hobbies: guitar, reading, exercise    Objective: BP 133/82   Pulse 75   Temp 98 F (36.7 C) (Oral)   Ht 5' 11.5" (1.816 m)   Wt 216 lb 12.8 oz (98.3 kg)   SpO2 95%   BMI 29.82 kg/m  Gen: NAD, resting comfortably HEENT: Mucous membranes are moist. Oropharynx normal Neck: no thyromegaly CV: RRR no murmurs rubs or gallops Lungs: CTAB no crackles, wheeze, rhonchi Abdomen: soft/nontender/nondistended/normal bowel sounds. No rebound or guarding.  Ext: no edema Skin: warm, dry Neuro: grossly normal, moves all extremities, PERRLA Rectal: normal tone, normal sized prostate, no masses or tenderness  Assessment/Plan:  Welcome to Medicare exam completed- discussed recommended screenings anddocumented any personalized health advice and referrals for preventive counseling. See AVS as well which was given to patient.   Status of chronic or acute concerns   GERD GERD- controlled on nexium 20mg  OTC- prescription was not covered anymore  GAD (generalized anxiety disorder) GAD/insomnia- controlled on zoloft in the past at 150mg . GAD7 today of 4. PHQ9 of 3 (phq2 of 0- just has sleep issues). Still with very short fuse- thinking about anger management- I told him I thiought this was very reasonable. Also using xanax before bed to help with sleep.  GAD 7 : Generalized Anxiety Score 12/15/2017  Nervous, Anxious, on Edge 1  Control/stop worrying 0  Worry too much - different things 0  Trouble relaxing 0  Restless 0  Easily annoyed or irritable 3  Afraid - awful might happen 0  Total GAD 7 Score 4  Anxiety Difficulty Somewhat difficult    OBSTRUCTIVE SLEEP APNEA OSA on cpap- advised compliance, he is doing well with this  Insomnia NCCSRS with only fills from me on xanax and midrin. He does not drink alcohol. Refilled  xanax for 3 months. Can refill again in 3 months  but want to see him in 6 months  Essential hypertension S: poorly controlled on amlodipine 5mg . From visit 2017 he was supposed to follow up with [place] within 2 months with home monitoring but he did not reach back out BP Readings from Last 3 Encounters:  12/15/17 133/82  10/13/16 (!) 144/80  08/27/15 138/70  A/P: We discussed blood pressure goal of <140/90. With family history- he wants to push for lower- we will increase amlodipine to 10 mg and repeat in 6 months  Hyperglycemia Hyperglycemia- update cbg and a1c. Discussed getting weight back to 200 with healthier eating  Hyperlipidemia Hyperlipidemia- mild poor control on atorvastatin 10mg  in past. Advised weight loss-  He is down 4 lbs from that time Wt Readings from Last 3 Encounters:  12/15/17 216 lb 12.8 oz (98.3 kg)  10/13/16 219 lb 3.2 oz (99.4 kg)  08/27/15 205 lb (93 kg)    Chronic daily headache Chronic daily headache-imipramine really helps him. Uses ansaid as well about 4x a week recently but that has been since running out of imipramine- hopefully can get to twice a week. Also uses midrin if flurbiprofen fails- which is rare perhaps twice a month -hoping headaches improve back on imipramine.  Ideally would use an ansaid/flurbiprofin and Midrin less than 3 times a week  Genital herpes Has plenty of Valtrex available  Former smoker Quit in 1998.  28 pack years. AAA screen ordered 12/15/17.   Sensitive skin Rash behind left knee intermittently.  Hydrocortisone helps some.  -With baseline sensitive skin I advised Eucerin regularly. - Tramicinolone as needed.  Needs at least a week between treatments  UA with leukocytes- get urine culture considering his urinary hesitancy.   6 month visit with me then awv in 1 year with nursing and can see me same day  Lab/Order associations:  Urinary hesitancy - Plan: PSA  Need for prophylactic vaccination against Streptococcus  pneumoniae (pneumococcus) - Plan: Pneumococcal polysaccharide vaccine 23-valent greater than or equal to 2yo subcutaneous/IM  Essential hypertension  Hyperlipidemia, unspecified hyperlipidemia type - Plan: CBC, Comprehensive metabolic panel, Lipid panel  Hyperglycemia - Plan: Hemoglobin A1c  Former smoker - Plan: POCT Urinalysis Dipstick (Automated), VAS US AORTA MEDICARE SCREEN  Leukocytes in urine - Plan: Urine Culture  Meds ordered this encounter  Medications  . triamcinolone cream (KENALOG) 0.1 %    Sig: Apply 1 application topically 2 (two) times daily. For 7-10 days maximum    Dispense:  80 g    Refill:  1  . ALPRAZolam (XANAX) 1 MG tablet    Sig: TAKE 1 TABLET BY MOUTH EVERY NIGHT AT BEDTIME AS NEEDED FOR ANXIETY    Dispense:  90 tablet    Refill:  0  . imipramine (TOFRANIL) 50 MG tablet    Sig: Take 1 tablet (50 mg total) by mouth daily.    Dispense:  90 tablet    Refill:  1  . flurbiprofen (ANSAID) 100 MG tablet    Sig: take 1 tablet by mouth twice a day if needed for headache NO MORE THAN 4 DAYS PER WEEK    Dispense:  16 tablet    Refill:  5  . isometheptene-acetaminophen-dichloralphenazone (MIDRIN) 65-100-325 MG capsule    Sig: May take once an hour as needed (max 5 per day) for migraines. Max twice per week    Dispense:  15 capsule    Refill:  5  . atorvastatin (LIPITOR) 10 MG tablet    Sig: Take 1 tablet (10 mg  total) by mouth daily.    Dispense:  90 tablet    Refill:  1  . amLODipine (NORVASC) 10 MG tablet    Sig: Take 1 tablet (10 mg total) by mouth daily.    Dispense:  90 tablet    Refill:  1    Return precautions advised. Tana Conch, MD

## 2017-12-15 NOTE — Assessment & Plan Note (Signed)
Chronic daily headache-imipramine really helps him. Uses ansaid as well about 4x a week recently but that has been since running out of imipramine- hopefully can get to twice a week. Also uses midrin if flurbiprofen fails- which is rare perhaps twice a month -hoping headaches improve back on imipramine.  Ideally would use an ansaid/flurbiprofin and Midrin less than 3 times a week

## 2017-12-15 NOTE — Assessment & Plan Note (Signed)
Quit in 1998.  28 pack years. AAA screen ordered 12/15/17.

## 2017-12-15 NOTE — Assessment & Plan Note (Signed)
Rash behind left knee intermittently.  Hydrocortisone helps some.  -With baseline sensitive skin I advised Eucerin regularly. - Tramicinolone as needed.  Needs at least a week between treatments

## 2017-12-15 NOTE — Assessment & Plan Note (Signed)
GERD- controlled on nexium 20mg  OTC- prescription was not covered anymore

## 2017-12-15 NOTE — Assessment & Plan Note (Signed)
S: poorly controlled on amlodipine 5mg . From visit 2017 he was supposed to follow up with us within 2 months with home monitoring but he did not reach back out BP Readings from Last 3 Encounters:  12/15/17 133/82  10/13/16 (!) 144/80  08/27/15 138/70  A/P: We discussed blood pressure goal of <140/90. With family history- he wants to push for lower- we will increase amlodipine to 10 mg and repeat in 6 months

## 2017-12-15 NOTE — Assessment & Plan Note (Signed)
GAD/insomnia- controlled on zoloft in the past at 150mg . GAD7 today of 4. PHQ9 of 3 (phq2 of 0- just has sleep issues). Still with very short fuse- thinking about anger management- I told him I thiought this was very reasonable. Also using xanax before bed to help with sleep.  GAD 7 : Generalized Anxiety Score 12/15/2017  Nervous, Anxious, on Edge 1  Control/stop worrying 0  Worry too much - different things 0  Trouble relaxing 0  Restless 0  Easily annoyed or irritable 3  Afraid - awful might happen 0  Total GAD 7 Score 4  Anxiety Difficulty Somewhat difficult

## 2017-12-15 NOTE — Patient Instructions (Addendum)
  Mr. Tomasa BlaseBurkhardt , Thank you for taking time to come for your Welcome to Medicare exam. I appreciate your ongoing commitment to your health goals. Please review the following plan we discussed and let me know if I can assist you in the future.   These are the goals we discussed: 1. Please consider getting Shingrix at your pharmacy- 2 shot series to reduce your risk of shingles 2. Consider getting updated eye exam 3. Please stop by lab before you go 4. We will call you within a week or two about your referral to aneurysm screening. If you do not hear within 3 weeks, give us a call.  5. Increase amlodipine to 10mg - we decided to be more aggressive with blood pressure given family history. If significant swelling in legs or very lightheaded please go back to 5mg  dose and let us know 6. See me in 6 months to recheck blood pressure and for refills on the higher risk medicines like xanax. In 1 year- you can do a medicare wellness visit with our nurse and also see me same day    This is a list of the screening recommended for you and due dates:  Health Maintenance  Topic Date Due  . Colon Cancer Screening  08/21/2025  . Tetanus Vaccine  10/21/2026  . Flu Shot  Completed  .  Hepatitis C: One time screening is recommended by Center for Disease Control  (CDC) for  adults born from [birth year] through 1965.   Completed  . Pneumonia vaccines  Completed

## 2017-12-15 NOTE — Assessment & Plan Note (Signed)
Has plenty of Valtrex available

## 2017-12-15 NOTE — Assessment & Plan Note (Signed)
NCCSRS with only fills from me on xanax and midrin. He does not drink alcohol. Refilled xanax for 3 months. Can refill again in 3 months but want to see him in 6 months

## 2017-12-15 NOTE — Assessment & Plan Note (Signed)
Hyperglycemia- update cbg and a1c. Discussed getting weight back to 200 with healthier eating

## 2017-12-17 LAB — URINE CULTURE
MICRO NUMBER: 90224831
SPECIMEN QUALITY: ADEQUATE

## 2017-12-18 ENCOUNTER — Other Ambulatory Visit: Payer: Self-pay | Admitting: Family Medicine

## 2017-12-18 MED ORDER — SULFAMETHOXAZOLE-TRIMETHOPRIM 800-160 MG PO TABS: 1.0000 | ORAL_TABLET | Freq: Two times a day (BID) | ORAL | 0 refills | Status: DC

## 2018-01-17 ENCOUNTER — Ambulatory Visit (HOSPITAL_COMMUNITY)
Admission: RE | Admit: 2018-01-17 | Discharge: 2018-01-17 | Disposition: A | Discharge status: Home / Self Care | Payer: Medicare Other | Source: Ambulatory Visit | Attending: Cardiovascular Disease | Admitting: Cardiovascular Disease

## 2018-01-17 DIAGNOSIS — I771 Stricture of artery: Secondary | ICD-10-CM | POA: Insufficient documentation

## 2018-01-17 DIAGNOSIS — Z87891 Personal history of nicotine dependence: Secondary | ICD-10-CM | POA: Insufficient documentation

## 2018-01-20 ENCOUNTER — Encounter: Payer: Self-pay | Admitting: Family Medicine

## 2018-03-11 ENCOUNTER — Other Ambulatory Visit: Payer: Self-pay | Admitting: Family Medicine

## 2018-03-11 MED ORDER — ALPRAZOLAM 1 MG PO TABS: ORAL_TABLET | ORAL | 0 refills | Status: DC

## 2018-03-14 ENCOUNTER — Encounter: Payer: Self-pay | Admitting: Family Medicine

## 2018-07-04 ENCOUNTER — Other Ambulatory Visit: Payer: Self-pay | Admitting: Family Medicine

## 2018-07-05 ENCOUNTER — Telehealth: Payer: Self-pay

## 2018-07-05 NOTE — Telephone Encounter (Signed)
Called and let patient know that we had sent in refills for his medication and the pharmacy sent back a notification that isometheptene-acetaminophen-dichloralphenazone (MIDRIN) 65-100-325 MG capsule Is no longer available and neither are the generics. They are no longer being manufactured. Dr. Durene Cal notified and he advised to notify patient and offer a neurology visit. I did offer to place a referral to neurology for patient but he declined stating he is going out of town for a month. He will let us know when he gets back if he wants a referral.

## 2018-09-16 ENCOUNTER — Other Ambulatory Visit: Payer: Self-pay | Admitting: Family Medicine

## 2018-09-29 ENCOUNTER — Encounter: Payer: Self-pay | Admitting: Family Medicine

## 2018-09-30 ENCOUNTER — Other Ambulatory Visit: Payer: Self-pay

## 2018-09-30 MED ORDER — ALPRAZOLAM 1 MG PO TABS: ORAL_TABLET | ORAL | 1 refills | Status: DC

## 2018-10-07 ENCOUNTER — Other Ambulatory Visit: Payer: Self-pay

## 2018-10-07 MED ORDER — SERTRALINE HCL 100 MG PO TABS: 150.0000 mg | ORAL_TABLET | Freq: Every day | ORAL | 3 refills | Status: DC

## 2019-01-06 ENCOUNTER — Encounter: Payer: Self-pay | Admitting: Family Medicine

## 2019-01-06 ENCOUNTER — Other Ambulatory Visit: Payer: Self-pay

## 2019-01-06 MED ORDER — SERTRALINE HCL 100 MG PO TABS: 150.0000 mg | ORAL_TABLET | Freq: Every day | ORAL | 3 refills | Status: DC

## 2019-02-21 ENCOUNTER — Telehealth: Payer: Self-pay | Admitting: Family Medicine

## 2019-02-21 NOTE — Telephone Encounter (Signed)
MEDICATION:  ALPRAZolam Prudy Feeler) 1 MG tablet  PHARMACY:   [facility] [address], [place] - [address] [phone] (Phone) [phone] (Fax)    IS THIS A 90 DAY SUPPLY : Y   IS PATIENT OUT OF MEDICATION: Y  IF NOT; HOW MUCH IS LEFT:   LAST APPOINTMENT DATE: @3 /13/2020  NEXT APPOINTMENT DATE:@7 /11/2018  OTHER COMMENTS:    **Let patient know to contact pharmacy at the end of the day to make sure medication is ready. **  ** Please notify patient to allow 48-72 hours to process**  **Encourage patient to contact the pharmacy for refills or they can request refills through [facility]**

## 2019-02-27 ENCOUNTER — Telehealth: Payer: Self-pay | Admitting: Family Medicine

## 2019-02-27 NOTE — Telephone Encounter (Signed)
Can give #30 imipramine. Looks like he has xanax from notes below available to him.

## 2019-02-27 NOTE — Telephone Encounter (Signed)
See phone note from 02/27/2019 also. Pt calling for refill of Xanax and impramine. Pt needs an ov for further refills. Pt last ov 11/2017.  Called pt to schedule an ov 2x no answer and no vm set up.   Okay for temp supply?  Please advise

## 2019-02-27 NOTE — Telephone Encounter (Signed)
Pt called to check on RX. He states he called pharmacy and they do not have. Last RX sent 09/30/18. [facility] called [facility] [address] and the RX from 09/30/18 has never been filled. [facility] with [facility] is having filled #90 doses with copay $5.20. Pt has been informed he can pick up in 1 hr.

## 2019-02-27 NOTE — Telephone Encounter (Signed)
See note

## 2019-02-27 NOTE — Telephone Encounter (Signed)
Copied from CRM [id number]. Topic: Quick Communication - Rx Refill/Question >> Feb 27, 2019 12:29 PM Maia Petties wrote: Medication: imipramine (TOFRANIL) 50 MG tablet - pt has a couple days left  Has the patient contacted their pharmacy? Yes - no refills (pt actually only requested alprazolam RX refill thru pharmacy - I called pharmacy and they have alprazolam on file - no refills left on imipramine) Preferred Pharmacy (with phone number or street name): [facility] [address] [place] - [address]

## 2019-02-27 NOTE — Telephone Encounter (Signed)
See other telephone encounter that has already been sent to Dr. Durene Cal.

## 2019-02-28 ENCOUNTER — Other Ambulatory Visit: Payer: Self-pay

## 2019-02-28 MED ORDER — IMIPRAMINE HCL 50 MG PO TABS: 50.0000 mg | ORAL_TABLET | Freq: Every day | ORAL | 0 refills | Status: DC

## 2019-02-28 NOTE — Telephone Encounter (Signed)
Rx sent in for Imipramine 50mg   #30, per Dr. Durene Cal.

## 2019-02-28 NOTE — Telephone Encounter (Signed)
Refill Imipramine 50mg  Last fill 07/04/18  #90/1 Last OV 11/27/17

## 2019-02-28 NOTE — Telephone Encounter (Signed)
Noted!  Imipramine filled for 30 days

## 2019-03-29 ENCOUNTER — Other Ambulatory Visit: Payer: Self-pay | Admitting: Family Medicine

## 2019-04-07 ENCOUNTER — Encounter: Payer: Self-pay | Admitting: Family Medicine

## 2019-04-07 NOTE — Telephone Encounter (Signed)
Last OV 12/15/17 Next OV 04/27/19

## 2019-04-08 ENCOUNTER — Encounter: Payer: Self-pay | Admitting: Family Medicine

## 2019-04-08 MED ORDER — SERTRALINE HCL 100 MG PO TABS: 150.0000 mg | ORAL_TABLET | Freq: Every day | ORAL | 0 refills | Status: DC

## 2019-04-08 MED ORDER — ATORVASTATIN CALCIUM 10 MG PO TABS: 10.0000 mg | ORAL_TABLET | Freq: Every day | ORAL | 0 refills | Status: DC

## 2019-04-08 MED ORDER — IMIPRAMINE HCL 50 MG PO TABS: 50.0000 mg | ORAL_TABLET | Freq: Every day | ORAL | 0 refills | Status: DC

## 2019-04-08 MED ORDER — AMLODIPINE BESYLATE 10 MG PO TABS: 10.0000 mg | ORAL_TABLET | Freq: Every day | ORAL | 0 refills | Status: DC

## 2019-04-08 NOTE — Telephone Encounter (Signed)
I am okay with refills- I do think patient needs to be seen sooner than July-please offer this so we can evaluate frequent urination.  I am also worried about diabetes

## 2019-04-10 ENCOUNTER — Ambulatory Visit: Payer: Medicare Other | Admitting: Family Medicine

## 2019-04-10 NOTE — Telephone Encounter (Signed)
F.Y.I  I spoke with patient and he agrees to sooner appointment.  Patient placed on schedule today.

## 2019-04-11 ENCOUNTER — Ambulatory Visit (INDEPENDENT_AMBULATORY_CARE_PROVIDER_SITE_OTHER): Payer: Medicare Other | Admitting: Family Medicine

## 2019-04-11 ENCOUNTER — Other Ambulatory Visit: Payer: Self-pay

## 2019-04-11 ENCOUNTER — Encounter: Payer: Self-pay | Admitting: Family Medicine

## 2019-04-11 VITALS — BP 130/80 | HR 78 | Temp 98.7°F | Ht 71.5 in | Wt 188.0 lb

## 2019-04-11 DIAGNOSIS — A6000 Herpesviral infection of urogenital system, unspecified: Secondary | ICD-10-CM | POA: Diagnosis not present

## 2019-04-11 DIAGNOSIS — Z125 Encounter for screening for malignant neoplasm of prostate: Secondary | ICD-10-CM

## 2019-04-11 DIAGNOSIS — F411 Generalized anxiety disorder: Secondary | ICD-10-CM

## 2019-04-11 DIAGNOSIS — I10 Essential (primary) hypertension: Secondary | ICD-10-CM | POA: Diagnosis not present

## 2019-04-11 DIAGNOSIS — Z0000 Encounter for general adult medical examination without abnormal findings: Secondary | ICD-10-CM | POA: Diagnosis not present

## 2019-04-11 DIAGNOSIS — E785 Hyperlipidemia, unspecified: Secondary | ICD-10-CM | POA: Diagnosis not present

## 2019-04-11 DIAGNOSIS — R3589 Other polyuria: Secondary | ICD-10-CM

## 2019-04-11 DIAGNOSIS — R351 Nocturia: Secondary | ICD-10-CM

## 2019-04-11 DIAGNOSIS — R739 Hyperglycemia, unspecified: Secondary | ICD-10-CM | POA: Diagnosis not present

## 2019-04-11 DIAGNOSIS — R358 Other polyuria: Secondary | ICD-10-CM

## 2019-04-11 LAB — POCT URINALYSIS DIP (MANUAL ENTRY)
Bilirubin, UA: NEGATIVE
Blood, UA: NEGATIVE
Glucose, UA: NEGATIVE mg/dL
Ketones, POC UA: NEGATIVE mg/dL
Leukocytes, UA: NEGATIVE
Nitrite, UA: NEGATIVE
Protein Ur, POC: NEGATIVE mg/dL
Spec Grav, UA: 1.01 (ref 1.010–1.025)
Urobilinogen, UA: 0.2 E.U./dL
pH, UA: 6 (ref 5.0–8.0)

## 2019-04-11 LAB — CBC
HCT: 42.5 % (ref 39.0–52.0)
Hemoglobin: 14.8 g/dL (ref 13.0–17.0)
MCHC: 34.7 g/dL (ref 30.0–36.0)
MCV: 86.8 fl (ref 78.0–100.0)
Platelets: 198 10*3/uL (ref 150.0–400.0)
RBC: 4.89 Mil/uL (ref 4.22–5.81)
RDW: 13.9 % (ref 11.5–15.5)
WBC: 7.4 10*3/uL (ref 4.0–10.5)

## 2019-04-11 LAB — COMPREHENSIVE METABOLIC PANEL
ALT: 19 U/L (ref 0–53)
AST: 21 U/L (ref 0–37)
Albumin: 4.8 g/dL (ref 3.5–5.2)
Alkaline Phosphatase: 78 U/L (ref 39–117)
BUN: 11 mg/dL (ref 6–23)
CO2: 24 mEq/L (ref 19–32)
Calcium: 10 mg/dL (ref 8.4–10.5)
Chloride: 103 mEq/L (ref 96–112)
Creatinine, Ser: 0.67 mg/dL (ref 0.40–1.50)
GFR: 117.81 mL/min (ref >60.00)
Glucose, Bld: 88 mg/dL (ref 70–99)
Potassium: 4.1 mEq/L (ref 3.5–5.1)
Sodium: 136 mEq/L (ref 135–145)
Total Bilirubin: 0.6 mg/dL (ref 0.2–1.2)
Total Protein: 7.3 g/dL (ref 6.0–8.3)

## 2019-04-11 LAB — LIPID PANEL
Cholesterol: 156 mg/dL (ref 0–200)
HDL: 34 mg/dL — ABNORMAL LOW (ref >39.00)
LDL Cholesterol: 103 mg/dL — ABNORMAL HIGH (ref 0–99)
NonHDL: 121.56
Total CHOL/HDL Ratio: 5
Triglycerides: 92 mg/dL (ref 0.0–149.0)
VLDL: 18.4 mg/dL (ref 0.0–40.0)

## 2019-04-11 LAB — PSA: PSA: 1.6 ng/mL (ref 0.10–4.00)

## 2019-04-11 LAB — POCT GLYCOSYLATED HEMOGLOBIN (HGB A1C): Hemoglobin A1C: 5.6 % (ref 4.0–5.6)

## 2019-04-11 LAB — TSH: TSH: 1.39 u[IU]/mL (ref 0.35–4.50)

## 2019-04-11 MED ORDER — ATORVASTATIN CALCIUM 10 MG PO TABS: 10.0000 mg | ORAL_TABLET | Freq: Every day | ORAL | 3 refills | Status: DC

## 2019-04-11 MED ORDER — IMIPRAMINE HCL 50 MG PO TABS: 50.0000 mg | ORAL_TABLET | Freq: Every day | ORAL | 3 refills | Status: DC

## 2019-04-11 MED ORDER — ALPRAZOLAM 1 MG PO TABS: ORAL_TABLET | ORAL | 1 refills | Status: DC

## 2019-04-11 MED ORDER — AMLODIPINE BESYLATE 10 MG PO TABS: 10.0000 mg | ORAL_TABLET | Freq: Every day | ORAL | 3 refills | Status: DC

## 2019-04-11 MED ORDER — FLURBIPROFEN 100 MG PO TABS: ORAL_TABLET | ORAL | 5 refills | Status: DC

## 2019-04-11 MED ORDER — SERTRALINE HCL 100 MG PO TABS: 150.0000 mg | ORAL_TABLET | Freq: Every day | ORAL | 3 refills | Status: DC

## 2019-04-11 NOTE — Patient Instructions (Addendum)
Lab Results  Component Value Date   HGBA1C 5.6 04/11/2019  at risk for diabetes starts at 5.7 so does not appear to be diabetes  No obvious urine infection.   Lets get basic labs- I am strongly leaning toward doing a 24 hour urine collection but lets start with initial labs to make sure it doesn't push [place] in a different direction  Refills provided- glad we have follow up next month regardless of above workup.   May also have you try to limit fluid intakes to see if that makes a difference (but hold off on that until after we have done labs)

## 2019-04-11 NOTE — Progress Notes (Signed)
Phone [phone]   Subjective:  [name] is a 68 y.o. year old very pleasant male patient who presents for/with See problem oriented charting Chief Complaint  Patient presents with  . Urinary Frequency   ROS- reports urinary frequency, prior weight loss and distaste for food now resolved. Reports some dry mouth. No fever, chills, cough reported.    Past Medical History-  Patient Active Problem List   Diagnosis Date Noted  . Hyperglycemia 08/27/2015    Priority: Medium  . Chronic daily headache 01/10/2015    Priority: Medium  . Insomnia 01/10/2015    Priority: Medium  . Hyperlipidemia 04/11/2009    Priority: Medium  . OBSTRUCTIVE SLEEP APNEA 07/08/2007    Priority: Medium  . Essential hypertension 07/08/2007    Priority: Medium  . GAD (generalized anxiety disorder) 06/15/2007    Priority: Medium  . Genital herpes 01/10/2015    Priority: Low  . GERD 07/08/2007    Priority: Low  . Former smoker 12/15/2017  . Sensitive skin 12/15/2017    Medications- reviewed and updated Current Outpatient Medications  Medication Sig Dispense Refill  . ALPRAZolam (XANAX) 1 MG tablet TAKE ONE TABLET BY MOUTH AT BEDTIME AS NEEDED FOR ANXIETY 90 tablet 1  . amLODipine (NORVASC) 10 MG tablet Take 1 tablet (10 mg total) by mouth daily. 90 tablet 3  . atorvastatin (LIPITOR) 10 MG tablet Take 1 tablet (10 mg total) by mouth daily. 90 tablet 3  . esomeprazole (NEXIUM) 40 MG capsule Take 1 capsule (40 mg total) by mouth daily as needed. 90 capsule 1  . flurbiprofen (ANSAID) 100 MG tablet take 1 tablet by mouth twice a day if needed for headache NO MORE THAN 4 DAYS PER WEEK 16 tablet 5  . imipramine (TOFRANIL) 50 MG tablet Take 1 tablet (50 mg total) by mouth daily. 90 tablet 3  . isometheptene-acetaminophen-dichloralphenazone (MIDRIN) 65-100-325 MG capsule MAY TAKE ONCE AN HOUR AS NEEDED (MAX 5 PER DAY) FOR MIGRAINES. MAX TWICE PER WEEK 15 capsule 4  . sertraline (ZOLOFT) 100 MG tablet  Take 1.5 tablets (150 mg total) by mouth daily. 135 tablet 3  . triamcinolone cream (KENALOG) 0.1 % Apply 1 application topically 2 (two) times daily. For 7-10 days maximum 80 g 1  . valACYclovir (VALTREX) 1000 MG tablet Take 1 tablet (1,000 mg total) by mouth 2 (two) times daily. 180 tablet 3   No current facility-administered medications for this visit.      Objective:  BP 130/80 (BP Location: Left Arm, Patient Position: Sitting, Cuff Size: Normal)   Pulse 78   Temp 98.7 F (37.1 C) (Oral)   Ht 5' 11.5" (1.816 m)   Wt 188 lb (85.3 kg)   SpO2 98%   BMI 25.86 kg/m  Gen: NAD, resting comfortably Mildly dry mucus membranes CV: RRR  Lungs: nonlabored, normal respiratory rate Abdomen: soft/nondistended Skin: warm, dry, no rash Neuro: grossly normal, moves all extremities     Assessment and Plan   # polyuria S: Patient quit drinking alcohol February 2019- after that he went to visit mother in law. Had UTI February 2019- treated for UTI with Morganella morganii- apparently could not stand smell of cheese at that time. Got to the point where he could not eat well. Around march April was able to get more and was eating yogurt almost all the time as he could tolerate it then started eating yogurt multiple times a day then went to ice cream then went to candy. He finally  quit that a week ago. Lost a lot of weight when wasn't eating and got down to 180.   Complains of heavy frequent urination for several months. Not having hesitancy like he did last year with UTI. No dysuria.  Feels like he goes to the bathroom and gets heavy flow of urine. At night waking up 5-6 times with 5-6x of urine and seems to be large volume. Drinking large volume. Always thirsty for water- always heavy water drinker so not a particular increase.  A/P: Initial labs are largely reassuring without obvious cause.  UA without UTI.  A1c not elevated to suggest diabetes.  Electrolytes are normal.  We will need to get 24-hour  urine protein testing to confirm polyuria and evaluate further. Need to know total volume of urine - we will also offer referral to endocrine under polyuria but would make sense to first confirm at least 3 L of fluid in urine  #hypertension S: controlled on Amlodipine 10 mg daily.  BP Readings from Last 3 Encounters:  04/11/19 130/80  12/15/17 133/82  10/13/16 (!) 144/80  A/P:  Stable. Continue current medications.    #hyperlipidemia S: Mild poorly controlled on Atorvastatin 10 mg daily.  Lab Results  Component Value Date   CHOL 156 04/11/2019   HDL 34.00 (L) 04/11/2019   LDLCALC 103 (H) 04/11/2019   LDLDIRECT 96.0 12/15/2017   TRIG 92.0 04/11/2019   CHOLHDL 5 04/11/2019   A/P: LDL over 100-we will discuss by my chart potentially increasing to atorvastatin 20 mg daily-may want to wait until after we have evaluated polyuria further  # GAD S:Taking Sertraline 150 mg daily and Alprazolam 1 mg prn.  Also on imipramine for chronic daily headaches. Reports anger issues as part of anxiety  Alcoholism big in family- he has cut down and feels like anger. Has done AA.  Certainly proud of him for cutting alcohol out A/P: Anxiety is not as well-controlled as patient would like-continue above medications but suggested adding counseling either with [place] [facility] or [facility]  # Genital Herpes S:  Valtrex 1000 mg BID listed but last fill was in 2018 so could not possibly be taken twice a day A/P: We will need to clarify at physical but 1000 mg dosing twice a day should only be 7 to 10 days for flareups-suppressive dosing should be 501,000 mg-we will check with patient.  Future Appointments  Date Time Provider [place]  04/27/2019 11:00 AM Marin Olp, MD [facility]   Lab/Order associations: FASTING   ICD-10-CM   1. Polyuria  R35.8 POCT urinalysis dipstick    PSA    Osmolality, urine    Sodium, urine, 24 hour    Creatinine, Urine, 24 Hour    Potassium,  urine, 24 hour    Chloride, urine, timed    Urea nitrogen (UUN), 24 hour urine    Glucose, Quantitative, Urine  2. Essential hypertension  I10   3. GAD (generalized anxiety disorder)  F41.1   4. Hyperlipidemia, unspecified hyperlipidemia type  E78.5 CBC    Comprehensive metabolic panel    Lipid panel    TSH  5. Hyperglycemia  R73.9 POCT glycosylated hemoglobin (Hb A1C)  6. Genital herpes simplex, unspecified site  A60.00   7. Preventative health care  Z00.00 POCT glycosylated hemoglobin (Hb A1C)    CBC    Comprehensive metabolic panel    Lipid panel    PSA    TSH  8. Screening for prostate cancer  Z12.5  9. Nocturia  R35.1 PSA    Meds ordered this encounter  Medications  . ALPRAZolam (XANAX) 1 MG tablet    Sig: TAKE ONE TABLET BY MOUTH AT BEDTIME AS NEEDED FOR ANXIETY    Dispense:  90 tablet    Refill:  1  . amLODipine (NORVASC) 10 MG tablet    Sig: Take 1 tablet (10 mg total) by mouth daily.    Dispense:  90 tablet    Refill:  3  . atorvastatin (LIPITOR) 10 MG tablet    Sig: Take 1 tablet (10 mg total) by mouth daily.    Dispense:  90 tablet    Refill:  3  . sertraline (ZOLOFT) 100 MG tablet    Sig: Take 1.5 tablets (150 mg total) by mouth daily.    Dispense:  135 tablet    Refill:  3  . imipramine (TOFRANIL) 50 MG tablet    Sig: Take 1 tablet (50 mg total) by mouth daily.    Dispense:  90 tablet    Refill:  3  . flurbiprofen (ANSAID) 100 MG tablet    Sig: take 1 tablet by mouth twice a day if needed for headache NO MORE THAN 4 DAYS PER WEEK    Dispense:  16 tablet    Refill:  5    Return precautions advised.  Tana ConchStephen Hanley Woerner, MD

## 2019-04-27 ENCOUNTER — Encounter: Payer: Self-pay | Admitting: Family Medicine

## 2019-04-27 ENCOUNTER — Other Ambulatory Visit: Payer: Self-pay

## 2019-04-27 ENCOUNTER — Ambulatory Visit (INDEPENDENT_AMBULATORY_CARE_PROVIDER_SITE_OTHER): Payer: Medicare Other | Admitting: Family Medicine

## 2019-04-27 VITALS — BP 120/82 | HR 76 | Ht 70.5 in | Wt 188.8 lb

## 2019-04-27 DIAGNOSIS — Z87891 Personal history of nicotine dependence: Secondary | ICD-10-CM | POA: Diagnosis not present

## 2019-04-27 DIAGNOSIS — Z0000 Encounter for general adult medical examination without abnormal findings: Secondary | ICD-10-CM

## 2019-04-27 MED ORDER — VALACYCLOVIR HCL 1 G PO TABS: 1000.0000 mg | ORAL_TABLET | Freq: Two times a day (BID) | ORAL | 3 refills | Status: DC | PRN

## 2019-04-27 NOTE — Progress Notes (Signed)
Phone: [phone]    Subjective:   Patient presents today for their annual wellness visit.    Preventive Screening-Counseling & Management  Smoking Status: former Smoker. Quit 2002 and 28 pack years.  UA 2 weeks ago normal. Refer for AAA screening today.  Second Hand Smoking status: No smokers in home Alcohol right now- 0 per week  Risk Factors Regular exercise: has not been exercising lately- had been doing the gym last year- he is going to try to start walking Diet: weight went down with all his stomach issues- lost a lot of weight but that is now better and has now stabilized. Mild queeziness at times still but short lived. Still staying well hydrated.  Encouraged need for healthy eating, regular exercise efforts should be continued.  Fall Risk: None  Fall Risk  04/11/2019 12/15/2017 10/13/2016  Falls in the past year? 0 No No  Opioid use history:  no long term opioids use  Cardiac risk factors:  advanced age (older than 1755 for men, 265 for women)   Hyperlipidemia - yes but on statin  Hypertension - yes but on medication No diabetes.  Family History: Mother had MI   Depression Screen None. PHQ2 0  [facility] 04/11/2019 12/15/2017 12/15/2017 10/13/2016  Decreased Interest 0 0 0 0  Down, Depressed, Hopeless 0 0 0 0  PHQ - 2 Score 0 0 0 0  Altered sleeping 0 3 - -  Tired, decreased energy 0 0 - -  Change in appetite 1 0 - -  Feeling bad or failure about yourself  0 0 - -  Trouble concentrating 0 0 - -  Moving slowly or fidgety/restless 0 0 - -  Suicidal thoughts 0 0 - -  PHQ-9 Score 1 3 - -  Difficult doing work/chores Very difficult Very difficult - -   Activities of Daily Living Independent ADLs and IADLs   Hearing Difficulties: -patient endorses. Hearing screen today . If fails- is to be given handout for audiology groups in [place]   Cognitive Testing             No reported trouble.   Did have immediate recall and remembered 3 first letters but  forgot the words after normal clock draw- screen again next year and consider MMSE if worsens.   List the Names of Other Physician/Practitioners you currently use: -[facility] has me as PCP  Immunization History  Administered Date(s) Administered  . Influenza Split 10/24/2012  . Influenza, High Dose Seasonal PF 10/13/2016  . Influenza,inj,Quad PF,6+ Mos 07/18/2013, 08/27/2015  . Influenza-Unspecified 11/29/2017  . Pneumococcal Conjugate-13 10/13/2016  . Pneumococcal Polysaccharide-23 12/15/2017  . Td 04/11/2009  . Tdap 10/21/2016   Required Immunizations needed today - discussed waiting on shingrix due to covid 19 potentially- he is going to consider and thinking about getting at pharmacy due to seeing mother in law with this Health Maintenance  Topic Date Due  . Flu Shot  05/27/2019  . Colon Cancer Screening  08/21/2025  . Tetanus Vaccine  10/21/2026  .  Hepatitis C: One time screening is recommended by Center for Disease Control  (CDC) for  adults born from [birth year] through 1965.   Completed  . Pneumonia vaccines  Completed    Screening tests- up to date 1. Colon cancer screening- 07/2015 with 10 year follow up 2. Lung cancer screening- aged out in 2017 3. Prostate cancer screening- overall low risk PSA trend just checked a few weeks ago- continue to monitor as some  increase from 2017 Lab Results  Component Value Date   PSA 1.60 04/11/2019   PSA 1.40 12/15/2017   PSA 0.88 10/14/2016   ROS- No pertinent positives discovered in course of AWV ROS pertinent to medical issues- No chest pain or shortness of breath. No new headaches or blurry vision. Still with large volume frequent urination  The following were reviewed and entered/updated in epic: Past Medical History:  Diagnosis Date  . Allergy   . ANXIETY 06/15/2007  . GERD 07/08/2007  . HEMATOCHEZIA 02/15/2009  . HERNIA, UMBILICAL 29/02/1883  . HYPERLIPIDEMIA 04/11/2009  . HYPERTENSION 07/08/2007  . OBSTRUCTIVE SLEEP APNEA 07/08/2007   . Sleep apnea    uses cpap   Patient Active Problem List   Diagnosis Date Noted  . Hyperglycemia 08/27/2015    Priority: Medium  . Chronic daily headache 01/10/2015    Priority: Medium  . Insomnia 01/10/2015    Priority: Medium  . Hyperlipidemia 04/11/2009    Priority: Medium  . OBSTRUCTIVE SLEEP APNEA 07/08/2007    Priority: Medium  . Essential hypertension 07/08/2007    Priority: Medium  . GAD (generalized anxiety disorder) 06/15/2007    Priority: Medium  . Genital herpes 01/10/2015    Priority: Low  . GERD 07/08/2007    Priority: Low  . Former smoker 12/15/2017  . Sensitive skin 12/15/2017   Past Surgical History:  Procedure Laterality Date  . bone spur removal foot     pretty regular numbness after surgery  . COLONOSCOPY  12-19-2004  . EYE SURGERY     lasik  . HAIR TRANSPLANT    . TONSILLECTOMY AND ADENOIDECTOMY    . UMBILICAL HERNIA REPAIR  2010    Family History  Problem Relation Age of Onset  . Cancer Mother        esophageal, former smoker  . Heart disease Mother 23       MI, former smoker  . Esophageal cancer Mother   . ALS Father   . Lung cancer Brother        smoker  . Multiple sclerosis Brother   . Colon cancer Neg Hx   . Colon polyps Neg Hx   . Rectal cancer Neg Hx   . Stomach cancer Neg Hx     Medications- reviewed and updated Current Outpatient Medications  Medication Sig Dispense Refill  . ALPRAZolam (XANAX) 1 MG tablet TAKE ONE TABLET BY MOUTH AT BEDTIME AS NEEDED FOR ANXIETY 90 tablet 1  . amLODipine (NORVASC) 10 MG tablet Take 1 tablet (10 mg total) by mouth daily. 90 tablet 3  . atorvastatin (LIPITOR) 10 MG tablet Take 1 tablet (10 mg total) by mouth daily. 90 tablet 3  . esomeprazole (NEXIUM) 40 MG capsule Take 1 capsule (40 mg total) by mouth daily as needed. 90 capsule 1  . flurbiprofen (ANSAID) 100 MG tablet take 1 tablet by mouth twice a day if needed for headache NO MORE THAN 4 DAYS PER WEEK 16 tablet 5  . imipramine (TOFRANIL)  50 MG tablet Take 1 tablet (50 mg total) by mouth daily. 90 tablet 3  . sertraline (ZOLOFT) 100 MG tablet Take 1.5 tablets (150 mg total) by mouth daily. 135 tablet 3  . triamcinolone cream (KENALOG) 0.1 % Apply 1 application topically 2 (two) times daily. For 7-10 days maximum 80 g 1  . valACYclovir (VALTREX) 1000 MG tablet Take 1 tablet (1,000 mg total) by mouth 2 (two) times daily as needed (for 7-10 days with flare ups). 60 tablet  3   No current facility-administered medications for this visit.     Allergies-reviewed and updated Allergies  Allergen Reactions  . Horse-Derived Products     REACTION: rash    Social History   Socioeconomic History  . Marital status: Married    Spouse name: Not on file  . Number of children: Not on file  . Years of education: Not on file  . Highest education level: Not on file  Occupational History  . Occupation: retired  Engineer, productionocial Needs  . Financial resource strain: Not on file  . Food insecurity    Worry: Not on file    Inability: Not on file  . Transportation needs    Medical: Not on file    Non-medical: Not on file  Tobacco Use  . Smoking status: Former Smoker    Packs/day: 1.50    Years: 20.00    Pack years: 30.00    Types: Cigarettes    Quit date: 01/26/2001    Years since quitting: 18.2  . Smokeless tobacco: Never Used  . Tobacco comment: Encouraged to remain smoke free  Substance and Sexual Activity  . Alcohol use: No    Alcohol/week: 0.0 standard drinks    Comment: alcoholic. Sparing drinks currently-once a month or less  . Drug use: No  . Sexual activity: Not on file  Lifestyle  . Physical activity    Days per week: Not on file    Minutes per session: Not on file  . Stress: Not on file  Relationships  . Social Musicianconnections    Talks on phone: Not on file    Gets together: Not on file    Attends religious service: Not on file    Active member of club or organization: Not on file    Attends meetings of clubs or  organizations: Not on file    Relationship status: Not on file  Other Topics Concern  . Not on file  Social History Narrative   Married (wife patient outside practice). 2 daughters. 2 grandkids-1 boy and 1 girl.       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       Hobbies: guitar, reading, exercise      Objective:  BP 120/82   Pulse 76   Ht 5' 10.5" (1.791 m)   Wt 188 lb 12.8 oz (85.6 kg)   BMI 26.71 kg/m  Gen: NAD, resting comfortably HEENT: Mucous membranes are moist. Oropharynx normal Neck: no thyromegaly CV: RRR no murmurs rubs or gallops Lungs: CTAB no crackles, wheeze, rhonchi Abdomen: soft/nontender/nondistended/normal bowel sounds. No rebound or guarding.  Ext: no edema Skin: warm, dry Neuro: grossly normal, moves all extremities, PERRLA   Assessment/Plan:  AWV completed- discussed recommended screenings and documented any personalized health advice and referrals for preventive counseling. See AVS as well which was given to patient.   Status of chronic or acute concerns  HTN - Taking Amlodipine 10 mg daily. Some lightheadedness with standing- we discussed possibly doing 5 mg dose but would need to do home monitoring to make sure blood pressure remains <140/90  Hyperlipidemia - Taking Atorvastatin 10 mg daily. Considering increase with LDL slightly above 100- waiting until polyuria evaluation completed first.   Polyuria- going to pick up 24 hour urine collection today.   GAD - Taking Sertraline 150 mg daily and Alprazolam 1 mg as needed.   GERD - Taking Esomeprazole 40 mg.   Headaches reasonably stable- used to  follow with neurology- uses flurbiprofen. Takes imipramine for prevention. Stopped making midrin so not using anymore  Genital herpes- only using for flare ups for 7-10 days- will update prescription. Perhaps twice a year  Follow up based on urine and otherwise a 6 month check in would be  reasonable Lab/Order associations:   ICD-10-CM   1. Preventative health care  Z00.00 VAS US [facility]ORTA MEDICARE SCREEN  2. Former smoker  Z87.891 VAS US AORTA MEDICARE SCREEN    Meds ordered this encounter  Medications  . valACYclovir (VALTREX) 1000 MG tablet    Sig: Take 1 tablet (1,000 mg total) by mouth 2 (two) times daily as needed (for 7-10 days with flare ups).    Dispense:  60 tablet    Refill:  3    Return precautions advised. Tana ConchStephen Hunter, MD

## 2019-04-27 NOTE — Patient Instructions (Addendum)
Stop by lab to pick up urine collection  If you fail hearing test here- please call one of the audiologists on the list to have more comprehensive evaluation. [facility] is also a good choices  [name] , Thank you for taking time to come for your Medicare Wellness Visit. I appreciate your ongoing commitment to your health goals. Please review the following plan we discussed and let me know if I can assist you in the future.   These are the goals we discussed: 1. Taking Amlodipine 10 mg daily. Some lightheadedness with standing- we discussed possibly doing 5 mg dose but would need to do home monitoring to make sure blood pressure remains <140/90   This is a list of the screening recommended for you and due dates:  Health Maintenance  Topic Date Due  . Flu Shot  05/27/2019  . Colon Cancer Screening  08/21/2025  . Tetanus Vaccine  10/21/2026  .  Hepatitis C: One time screening is recommended by Center for Disease Control  (CDC) for  adults born from [birth year] through 1965.   Completed  . Pneumonia vaccines  Completed

## 2019-05-01 ENCOUNTER — Other Ambulatory Visit: Payer: Medicare Other

## 2019-05-01 DIAGNOSIS — R3589 Other polyuria: Secondary | ICD-10-CM

## 2019-05-01 DIAGNOSIS — R358 Other polyuria: Secondary | ICD-10-CM

## 2019-05-02 LAB — SODIUM, URINE, 24 HOUR
Sodium, 24H Ur: 90 mmol/24 hr (ref 58–337)
Sodium, Ur: 31 mmol/L

## 2019-05-02 LAB — CHLORIDE, URINE, TIMED
Chloride Urine: 107 mmol/24 hr (ref 52–264)
Chloride, Ur: 37 mmol/L

## 2019-05-02 LAB — POTASSIUM, URINE, 24 HOUR
Potassium Urine: 41.6 mmol/L
Potassium, Urine: 121 mmol/24 hr — ABNORMAL HIGH (ref 20–116)

## 2019-05-02 LAB — UREA NITROGEN (UUN), 24 HR URINE
Urea Nitrogen, 24hr: 15 g/24 hr (ref 5–22)
Urea Nitrogen, Ur: 528 mg/dL

## 2019-05-02 LAB — CREATININE, URINE, 24 HOUR
Creatinine, 24H Ur: 1720 mg/24 hr (ref 1000–2000)
Creatinine, Urine: 59.3 mg/dL

## 2019-05-04 ENCOUNTER — Telehealth: Payer: Self-pay

## 2019-05-04 NOTE — Telephone Encounter (Signed)
Unable to contact pt. Called pt spouse who stated she was in [place]. She stated her husband barely uses his phone but she will relay the message.   Message: Per lab tech, pt needs to come to the office to pick up urine cups to give samples.

## 2019-05-08 ENCOUNTER — Other Ambulatory Visit: Payer: Self-pay

## 2019-05-08 ENCOUNTER — Other Ambulatory Visit: Payer: Medicare Other

## 2019-05-08 DIAGNOSIS — R3589 Other polyuria: Secondary | ICD-10-CM

## 2019-05-08 DIAGNOSIS — R358 Other polyuria: Secondary | ICD-10-CM

## 2019-05-09 LAB — SPECIMEN STATUS REPORT

## 2019-05-10 ENCOUNTER — Other Ambulatory Visit: Payer: Self-pay

## 2019-05-10 ENCOUNTER — Ambulatory Visit (HOSPITAL_COMMUNITY)
Admission: RE | Admit: 2019-05-10 | Discharge: 2019-05-10 | Disposition: A | Discharge status: Home / Self Care | Payer: Medicare Other | Source: Ambulatory Visit | Attending: Cardiovascular Disease | Admitting: Cardiovascular Disease

## 2019-05-10 DIAGNOSIS — Z0000 Encounter for general adult medical examination without abnormal findings: Secondary | ICD-10-CM | POA: Insufficient documentation

## 2019-05-10 DIAGNOSIS — Z136 Encounter for screening for cardiovascular disorders: Secondary | ICD-10-CM

## 2019-05-10 DIAGNOSIS — Z87891 Personal history of nicotine dependence: Secondary | ICD-10-CM | POA: Insufficient documentation

## 2019-05-10 LAB — OSMOLALITY, URINE: Osmolality, Ur: 195 mOsm/kg (ref 50–1200)

## 2019-05-11 LAB — SPECIMEN STATUS REPORT

## 2019-05-11 LAB — OSMOLALITY, URINE

## 2019-05-13 ENCOUNTER — Encounter: Payer: Self-pay | Admitting: Family Medicine

## 2019-05-15 ENCOUNTER — Other Ambulatory Visit: Payer: Self-pay | Admitting: Physical Therapy

## 2019-05-15 DIAGNOSIS — R3589 Other polyuria: Secondary | ICD-10-CM

## 2019-05-15 DIAGNOSIS — R358 Other polyuria: Secondary | ICD-10-CM

## 2019-05-19 LAB — GLUCOSE, QUANTITATIVE, URINE

## 2019-06-13 ENCOUNTER — Other Ambulatory Visit: Payer: Self-pay | Admitting: Family Medicine

## 2019-06-13 ENCOUNTER — Telehealth: Payer: Self-pay | Admitting: Family Medicine

## 2019-06-13 DIAGNOSIS — R94120 Abnormal auditory function study: Secondary | ICD-10-CM

## 2019-06-13 NOTE — Addendum Note (Signed)
Addended by: Jasper Loser on: 06/13/2019 05:01 PM   Modules accepted: Orders

## 2019-06-13 NOTE — Telephone Encounter (Signed)
Referral has been placed and faxed to [facility]. Pt aware.

## 2019-06-13 NOTE — Telephone Encounter (Signed)
See note  Copied from [place] [id number]. Topic: General - Other >> Jun 13, 2019  4:28 PM Celene Kras A wrote: Reason for CRM: Pts wife called stating pt has an appt with an audiologist pn 06/14/2019 at 10:30. Pt is needing a referral before the appt in order to be seen. Please advise. >> Jun 13, 2019  4:31 PM Celene Kras A wrote: Fax # [phone]  email: [email or web address]

## 2019-06-14 DIAGNOSIS — H903 Sensorineural hearing loss, bilateral: Secondary | ICD-10-CM | POA: Diagnosis not present

## 2019-06-14 DIAGNOSIS — Z77122 Contact with and (suspected) exposure to noise: Secondary | ICD-10-CM | POA: Diagnosis not present

## 2019-07-07 ENCOUNTER — Other Ambulatory Visit: Payer: Self-pay

## 2019-07-11 ENCOUNTER — Encounter: Payer: Self-pay | Admitting: Endocrinology

## 2019-07-11 ENCOUNTER — Ambulatory Visit (INDEPENDENT_AMBULATORY_CARE_PROVIDER_SITE_OTHER): Payer: Medicare Other | Admitting: Endocrinology

## 2019-07-11 ENCOUNTER — Other Ambulatory Visit: Payer: Self-pay

## 2019-07-11 DIAGNOSIS — R358 Other polyuria: Secondary | ICD-10-CM | POA: Insufficient documentation

## 2019-07-11 DIAGNOSIS — R3589 Other polyuria: Secondary | ICD-10-CM | POA: Insufficient documentation

## 2019-07-11 LAB — BASIC METABOLIC PANEL
BUN: 12 mg/dL (ref 6–23)
CO2: 28 mEq/L (ref 19–32)
Calcium: 10.7 mg/dL — ABNORMAL HIGH (ref 8.4–10.5)
Chloride: 102 mEq/L (ref 96–112)
Creatinine, Ser: 0.81 mg/dL (ref 0.40–1.50)
GFR: 94.57 mL/min (ref >60.00)
Glucose, Bld: 104 mg/dL — ABNORMAL HIGH (ref 70–99)
Potassium: 5 mEq/L (ref 3.5–5.1)
Sodium: 136 mEq/L (ref 135–145)

## 2019-07-11 LAB — URINALYSIS, ROUTINE W REFLEX MICROSCOPIC
Bilirubin Urine: NEGATIVE
Hgb urine dipstick: NEGATIVE
Ketones, ur: NEGATIVE
Leukocytes,Ua: NEGATIVE
Nitrite: NEGATIVE
RBC / HPF: NONE SEEN (ref 0–?)
Specific Gravity, Urine: 1.015 (ref 1.000–1.030)
Total Protein, Urine: NEGATIVE
Urine Glucose: NEGATIVE
Urobilinogen, UA: 0.2 (ref 0.0–1.0)
WBC, UA: NONE SEEN (ref 0–?)
pH: 6 (ref 5.0–8.0)

## 2019-07-11 NOTE — Patient Instructions (Signed)
Blood and urine tests are requested for you today.  We'll let you know about the results.  Also, please measure the total amount of urine per 24 hrs, in any container.  Please do this twice, and let us know. If the total amount is not a huge amount, sometimes the symptoms are due to the prostate.  Therefore, if the amount is not too much, you should consider seeing a urologist.  I would be happy to refer you.

## 2019-07-11 NOTE — Progress Notes (Signed)
Subjective:    Patient ID: [name], male    DOB: August 22, 1951, 68 y.o.   MRN: [id number]  HPI Pt is ref by Dr [name], for polyuria.  He reports 1 year of slight dry mouth, and assoc polyuria.  He has assoc excessive thirst.  He has no h/o pituitary or renal disease.   Past Medical History:  Diagnosis Date  . Allergy   . ANXIETY 06/15/2007  . GERD 07/08/2007  . HEMATOCHEZIA 02/15/2009  . HERNIA, UMBILICAL 77/06/3902  . HYPERLIPIDEMIA 04/11/2009  . HYPERTENSION 07/08/2007  . OBSTRUCTIVE SLEEP APNEA 07/08/2007  . Sleep apnea    uses cpap    Past Surgical History:  Procedure Laterality Date  . bone spur removal foot     pretty regular numbness after surgery  . COLONOSCOPY  12-19-2004  . EYE SURGERY     lasik  . HAIR TRANSPLANT    . TONSILLECTOMY AND ADENOIDECTOMY    . UMBILICAL HERNIA REPAIR  2010    Social History   Socioeconomic History  . Marital status: Married    Spouse name: Not on file  . Number of children: Not on file  . Years of education: Not on file  . Highest education level: Not on file  Occupational History  . Occupation: retired  Scientific laboratory technician  . Financial resource strain: Not on file  . Food insecurity    Worry: Not on file    Inability: Not on file  . Transportation needs    Medical: Not on file    Non-medical: Not on file  Tobacco Use  . Smoking status: Former Smoker    Packs/day: 1.50    Years: 20.00    Pack years: 30.00    Types: Cigarettes    Quit date: 01/26/2001    Years since quitting: 18.4  . Smokeless tobacco: Never Used  . Tobacco comment: Encouraged to remain smoke free  Substance and Sexual Activity  . Alcohol use: No    Alcohol/week: 0.0 standard drinks    Comment: alcoholic. Sparing drinks currently-once a month or less  . Drug use: No  . Sexual activity: Not on file  Lifestyle  . Physical activity    Days per week: Not on file    Minutes per session: Not on file  . Stress: Not on file  Relationships  . Social  Herbalist on phone: Not on file    Gets together: Not on file    Attends religious service: Not on file    Active member of club or organization: Not on file    Attends meetings of clubs or organizations: Not on file    Relationship status: Not on file  . Intimate partner violence    Fear of current or ex partner: Not on file    Emotionally abused: Not on file    Physically abused: Not on file    Forced sexual activity: Not on file  Other Topics Concern  . Not on file  Social History Narrative   Married (wife patient outside practice). 2 daughters. 2 grandkids-1 boy and 1 girl.       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       Hobbies: guitar, reading, exercise    Current Outpatient Medications on File Prior to Visit  Medication Sig Dispense Refill  . ALPRAZolam (XANAX) 1 MG tablet TAKE ONE TABLET BY MOUTH AT BEDTIME AS NEEDED FOR ANXIETY 90 tablet  1  . amLODipine (NORVASC) 10 MG tablet Take 1 tablet (10 mg total) by mouth daily. 90 tablet 3  . atorvastatin (LIPITOR) 10 MG tablet Take 1 tablet (10 mg total) by mouth daily. 90 tablet 3  . esomeprazole (NEXIUM) 40 MG capsule Take 1 capsule (40 mg total) by mouth daily as needed. 90 capsule 1  . flurbiprofen (ANSAID) 100 MG tablet take 1 tablet by mouth twice a day if needed for headache NO MORE THAN 4 DAYS PER WEEK 16 tablet 5  . imipramine (TOFRANIL) 50 MG tablet Take 1 tablet (50 mg total) by mouth daily. 90 tablet 3  . sertraline (ZOLOFT) 100 MG tablet Take 1.5 tablets (150 mg total) by mouth daily. 135 tablet 3  . triamcinolone cream (KENALOG) 0.1 % Apply 1 application topically 2 (two) times daily. For 7-10 days maximum 80 g 1  . valACYclovir (VALTREX) 1000 MG tablet Take 1 tablet (1,000 mg total) by mouth 2 (two) times daily as needed (for 7-10 days with flare ups). 60 tablet 3   No current facility-administered medications on file prior to visit.      Allergies  Allergen Reactions  . Horse-Derived Products     REACTION: rash    Family History  Problem Relation Age of Onset  . Cancer Mother        esophageal, former smoker  . Heart disease Mother [id number]       MI, former smoker  . Esophageal cancer Mother   . ALS Father   . Lung cancer Brother        smoker  . Multiple sclerosis Brother   . Colon cancer Neg Hx   . Colon polyps Neg Hx   . Rectal cancer Neg Hx   . Stomach cancer Neg Hx   . Renal Disease Neg Hx   . Other Neg Hx        pituitary disease    BP 118/62 (BP Location: Left Arm, Patient Position: Sitting, Cuff Size: Normal)   Pulse 84   Ht 5' 10.5" (1.791 m)   Wt 187 lb 12.8 oz (85.2 kg)   SpO2 99%   BMI 26.57 kg/m   Review of Systems denies loss of smell, syncope, rash, depression, headache, visual loss, gynecomastia, easy bruising, change in facial appearance, rhinorrhea, edema, and n/v.  He has lost a few lbs.       Objective:   Physical Exam VS: see vs page GEN: no distress HEAD: head: no deformity eyes: no periorbital swelling, no proptosis external nose and ears are normal NECK: supple, thyroid is not enlarged CHEST WALL: no deformity LUNGS: clear to auscultation CV: reg rate and rhythm, no murmur ABD: abdomen is soft, nontender.  no hepatosplenomegaly.  not distended.  no hernia MUSCULOSKELETAL: muscle bulk and strength are grossly normal.  no obvious joint swelling.  gait is normal and steady EXTEMITIES: no deformity.  no leg edema PULSES: no carotid bruit NEURO:  cn 2-12 grossly intact.   readily moves all 4's.  sensation is intact to touch on all 4's.   SKIN:  Normal texture and temperature.  No rash or suspicious lesion is visible.   NODES:  None palpable at the neck.   PSYCH: alert, well-oriented.  Does not appear anxious nor depressed.     Lab Results  Component Value Date   CREATININE 0.67 04/11/2019   BUN 11 04/11/2019   NA 136 04/11/2019   K 4.1 04/11/2019   CL 103 04/11/2019  CO2 24 04/11/2019   I have reviewed outside records, and summarized: Pt was noted to have polyuria, and referred here.  24-HR urine vol is not reported by lab, but is calculated by me to be approx 2900 cc/24 HRS  Lab Results  Component Value Date   HGBA1C 5.6 04/11/2019   Urine osm=195     Assessment & Plan:  Polyuria, new to me.  I asked pt to some more measurements, to better quantify.  Hypercalcemia, new.  I asked pt to come back to lab for more tests.  Headache: as meds are helping, I told pt he does not need to d/c them due to urinary sxs.    Patient Instructions  Blood and urine tests are requested for you today.  We'll let you know about the results.  Also, please measure the total amount of urine per 24 hrs, in any container.  Please do this twice, and let us know. If the total amount is not a huge amount, sometimes the symptoms are due to the prostate.  Therefore, if the amount is not too much, you should consider seeing a urologist.  I would be happy to refer you.

## 2019-07-13 ENCOUNTER — Other Ambulatory Visit: Payer: Self-pay

## 2019-07-14 ENCOUNTER — Other Ambulatory Visit: Payer: Medicare Other

## 2019-07-14 ENCOUNTER — Other Ambulatory Visit: Payer: Self-pay | Admitting: Endocrinology

## 2019-07-14 ENCOUNTER — Other Ambulatory Visit: Payer: Self-pay

## 2019-07-14 NOTE — Addendum Note (Signed)
Addended by: Kaylyn Lim I on: 07/14/2019 03:30 PM   Modules accepted: Orders

## 2019-07-17 LAB — PTH, INTACT AND CALCIUM
Calcium: 10.1 mg/dL (ref 8.6–10.3)
PTH: 59 pg/mL (ref 14–64)

## 2019-07-17 LAB — VITAMIN D 25 HYDROXY (VIT D DEFICIENCY, FRACTURES): Vit D, 25-Hydroxy: 22 ng/mL — ABNORMAL LOW (ref 30–100)

## 2019-07-19 LAB — VITAMIN A: Vitamin A (Retinoic Acid): 52 ug/dL (ref 38–98)

## 2019-07-22 LAB — VITAMIN D 1,25 DIHYDROXY
Vitamin D 1, 25 (OH)2 Total: 61 pg/mL (ref 18–72)
Vitamin D2 1, 25 (OH)2: <8 pg/mL
Vitamin D3 1, 25 (OH)2: 61 pg/mL

## 2019-07-22 LAB — PTH-RELATED PEPTIDE: PTH-Related Protein (PTH-RP): 7 pg/mL — ABNORMAL LOW (ref 14–27)

## 2019-09-26 DIAGNOSIS — Z23 Encounter for immunization: Secondary | ICD-10-CM | POA: Diagnosis not present

## 2019-10-14 ENCOUNTER — Other Ambulatory Visit: Payer: Self-pay | Admitting: Family Medicine

## 2019-10-16 NOTE — Telephone Encounter (Signed)
Please have him schedule appt for any future refills

## 2019-10-16 NOTE — Telephone Encounter (Signed)
LAST APPOINTMENT DATE: @7 /20/20  NEXT APPOINTMENT DATE:@no  f/u    LAST REFILL: 04/11/2019   QTY: #90 1 rf

## 2019-11-24 ENCOUNTER — Encounter: Payer: Self-pay | Admitting: Endocrinology

## 2019-11-24 ENCOUNTER — Encounter: Payer: Self-pay | Admitting: Family Medicine

## 2019-12-11 DIAGNOSIS — Z23 Encounter for immunization: Secondary | ICD-10-CM | POA: Diagnosis not present

## 2020-01-01 DIAGNOSIS — Z23 Encounter for immunization: Secondary | ICD-10-CM | POA: Diagnosis not present

## 2020-01-23 ENCOUNTER — Other Ambulatory Visit: Payer: Self-pay | Admitting: Family Medicine

## 2020-01-23 NOTE — Telephone Encounter (Signed)
From last note : Follow up based on urine and otherwise a 6 month check in would be reasonable  I general include follow up in my last note (or try to). If patient's are past that date please try to schedule them for follow up- I am generally wiling to give them enough to get to those appointments

## 2020-01-23 NOTE — Telephone Encounter (Signed)
Last refill: 12.21.20 #90,0  Last OV: 7.2.20 dx. CPE

## 2020-01-24 NOTE — Telephone Encounter (Signed)
Patient has been scheduled for 02/23/20 for a Virtual visit with Dr. Durene Cal

## 2020-02-21 ENCOUNTER — Other Ambulatory Visit: Payer: Self-pay | Admitting: Family Medicine

## 2020-02-22 NOTE — Progress Notes (Signed)
Phone [phone] Virtual visit via Video note   Subjective:  Chief complaint: Chief Complaint  Patient presents with  . Medication Refill  . Hypertension  . Hyperlipidemia  . Anxiety   This visit type was conducted due to national recommendations for restrictions regarding the COVID-19 Pandemic (e.g. social distancing).  This format is felt to be most appropriate for this patient at this time balancing risks to patient and risks to population by having him in for in person visit.  No physical exam was performed (except for noted visual exam or audio findings with Telehealth visits).    Our team/I connected with [name] at  9:40 AM EDT by a video enabled telemedicine application (doxy.me or caregility through epic) and verified that I am speaking with the correct person using two identifiers.  Location patient: [place] Location provider: [facility], office Persons participating in the virtual visit:  patient  Our team/I discussed the limitations of evaluation and management by telemedicine and the availability of in person appointments. In light of current covid-19 pandemic, patient also understands that we are trying to protect them by minimizing in office contact if at all possible.  The patient expressed consent for telemedicine visit and agreed to proceed. Patient understands insurance will be billed.   Past Medical History-  Patient Active Problem List   Diagnosis Date Noted  . Hyperglycemia 08/27/2015    Priority: Medium  . Chronic daily headache 01/10/2015    Priority: Medium  . Insomnia 01/10/2015    Priority: Medium  . Hyperlipidemia 04/11/2009    Priority: Medium  . OBSTRUCTIVE SLEEP APNEA 07/08/2007    Priority: Medium  . Essential hypertension 07/08/2007    Priority: Medium  . GAD (generalized anxiety disorder) 06/15/2007    Priority: Medium  . Genital herpes 01/10/2015    Priority: Low  . GERD 07/08/2007    Priority: Low  . Hypercalcemia  07/12/2019  . Polyuria 07/11/2019  . Former smoker 12/15/2017  . Sensitive skin 12/15/2017    Medications- reviewed and updated Current Outpatient Medications  Medication Sig Dispense Refill  . ALPRAZolam (XANAX) 1 MG tablet TAKE ONE TABLET BY MOUTH NIGHTLY AT BEDTIME AS NEEDED FOR ANXIETY 90 tablet 1  . amLODipine (NORVASC) 10 MG tablet Take 1 tablet (10 mg total) by mouth daily. 90 tablet 3  . atorvastatin (LIPITOR) 10 MG tablet Take 1 tablet (10 mg total) by mouth daily. 90 tablet 3  . esomeprazole (NEXIUM) 40 MG capsule Take 1 capsule (40 mg total) by mouth daily as needed. 90 capsule 1  . flurbiprofen (ANSAID) 100 MG tablet take 1 tablet by mouth twice a day if needed for headache NO MORE THAN 4 DAYS PER WEEK 16 tablet 5  . imipramine (TOFRANIL) 50 MG tablet Take 1 tablet (50 mg total) by mouth daily. 90 tablet 3  . sertraline (ZOLOFT) 100 MG tablet TAKE 1 AND A 1/2 TABLETS BY MOUTH DAILY 135 tablet 3  . triamcinolone cream (KENALOG) 0.1 % Apply 1 application topically 2 (two) times daily. For 7-10 days maximum for rash on leg 80 g 1  . valACYclovir (VALTREX) 1000 MG tablet Take 1 tablet (1,000 mg total) by mouth 2 (two) times daily as needed (for 7-10 days with flare ups). 60 tablet 3   No current facility-administered medications for this visit.     Objective:  BP 130/82   Ht 5' 10.5" (1.791 m)   Wt 187 lb (84.8 kg)   BMI 26.45 kg/m  self reported  vitals Gen: NAD, resting comfortably Lungs: nonlabored, normal respiratory rate  Skin: appears dry, no obvious rash    Assessment and Plan   # living in [place]- looking at establishing with new PCP.  We will give enough refills to allow him time to establish over the next 6 to 12 months  #hypertension S: medication: Amlodipine 10 mg-occasional lightheadedness with standing but rare.  Home readings #s:  he has not been checking at home but has been to several doctors and all have been normal.  A/P: Stable. Continue current  medications.    #hyperlipidemia S: Medication: Atorvastatin 10 mg.  In June 2020 mild poor control with LDL 103  Lab Results  Component Value Date   CHOL 156 04/11/2019   HDL 34.00 (L) 04/11/2019   LDLCALC 103 (H) 04/11/2019   LDLDIRECT 96.0 12/15/2017   TRIG 92.0 04/11/2019   CHOLHDL 5 04/11/2019   A/P: Very mild poor control-patient is going to focus on healthy eating/regular exercise and I encouraged him to establish with new PCP in [place] and get blood work updated sometime after June 16. For now - continue current meds  #Generalized anxiety disorder S: Medication: Sertraline 150 mg daily(still mildly irritable) and alprazolam 1 mg as needed-typically takes one tab nightly to sleep. He does take every night.   Just finished anger management course online recently. Did AA meetings in past and may go back- has remained alcohol free.  GAD 7 : Generalized Anxiety Score 04/11/2019 12/15/2017  Nervous, Anxious, on Edge 0 1  Control/stop worrying 0 0  Worry too much - different things 0 0  Trouble relaxing 0 0  Restless 3 0  Easily annoyed or irritable 3 3  Afraid - awful might happen 0 0  Total GAD 7 Score 6 4  Anxiety Difficulty Very difficult Somewhat difficult  A/P: Mild poor control of generalized anxiety disorder.  Ideally we would increase sertraline to try to help-I think the potential risk with him also being on imipramine makes [place] have to avoid this changes.  He will continue current medications and alprazolam at night for sleep.  He also is going to focus on some of the teachings from his anger management class and consider getting back into AA for therapy portion (has done great from alcohol perspective) as things open back from COVID-19 pandemic   #GERD S: Medication: Esomeprazole 20 mg rarely- has improved since quitting drinking. Symptoms have been doing well has not taken more than once a month for over 8 months.  A/P: Reasonable control of reflux on esomeprazole 20  mg sparing dose  #Headaches S: Previously followed by neurology Dr. Vela Prose.   Medication: Imipramine for prevention.  flurbiprofen for breakthrough headaches- he has not had breakthrough more than once a month and have been been mild when he has them.  A/P: good control- continue current meds   #Genital herpes S: Patient is compliant with Valtrex as needed 1000 milligrams twice daily for 7 to 10 days.  Flareups typically-twice a year A/P: Stable. Continue current medications.      #Polyuria #Hypercalcemia S: started in 2020, plan was for 24-hour urine collection.  Ultimately we referred patient to Dr. Everardo All of endocrinology.  Plan was for 1 month follow-up from September- ultimately patient did urine collection at home and was at acceptable volumes  Dr. Everardo All also evaluated for hypercalcemia with no obvious cause found. A/P: should establish with new PCP to follow up on calcium issues/update bloodwork. Glad Polyuria workup was reassuring.      #  Hyperglycemia/insulin resistance/prediabetes-hemoglobin A1c elevated in the past.  Would be worth rechecking with next set of blood work Lab Results  Component Value Date   HGBA1C 5.6 04/11/2019   HGBA1C 6.0 12/15/2017   HGBA1C 5.4 10/14/2016   Recommended follow up: He will establish with new PCP in [place]-I made a note to myself for 270 days to make sure he had a chance to establish  Lab/Order associations:   ICD-10-CM   1. Hyperlipidemia, unspecified hyperlipidemia type  E78.5   2. Essential hypertension  I10   3. GAD (generalized anxiety disorder)  F41.1   4. Chronic daily headache  R51.9   5. Gastroesophageal reflux disease without esophagitis  K21.9   6. Genital herpes simplex, unspecified site  A60.00     Meds ordered this encounter  Medications  . amLODipine (NORVASC) 10 MG tablet    Sig: Take 1 tablet (10 mg total) by mouth daily.    Dispense:  90 tablet    Refill:  3  . atorvastatin (LIPITOR) 10 MG tablet     Sig: Take 1 tablet (10 mg total) by mouth daily.    Dispense:  90 tablet    Refill:  3  . flurbiprofen (ANSAID) 100 MG tablet    Sig: take 1 tablet by mouth twice a day if needed for headache NO MORE THAN 4 DAYS PER WEEK    Dispense:  16 tablet    Refill:  5  . imipramine (TOFRANIL) 50 MG tablet    Sig: Take 1 tablet (50 mg total) by mouth daily.    Dispense:  90 tablet    Refill:  3  . sertraline (ZOLOFT) 100 MG tablet    Sig: TAKE 1 AND A 1/2 TABLETS BY MOUTH DAILY    Dispense:  135 tablet    Refill:  3  . valACYclovir (VALTREX) 1000 MG tablet    Sig: Take 1 tablet (1,000 mg total) by mouth 2 (two) times daily as needed (for 7-10 days with flare ups).    Dispense:  60 tablet    Refill:  3  . ALPRAZolam (XANAX) 1 MG tablet    Sig: TAKE ONE TABLET BY MOUTH NIGHTLY AT BEDTIME AS NEEDED FOR ANXIETY    Dispense:  90 tablet    Refill:  1  . triamcinolone cream (KENALOG) 0.1 %    Sig: Apply 1 application topically 2 (two) times daily. For 7-10 days maximum for rash on leg    Dispense:  80 g    Refill:  1   Return precautions advised.  Tana Conch, MD

## 2020-02-23 ENCOUNTER — Other Ambulatory Visit: Payer: Self-pay

## 2020-02-23 ENCOUNTER — Telehealth (INDEPENDENT_AMBULATORY_CARE_PROVIDER_SITE_OTHER): Payer: Medicare Other | Admitting: Family Medicine

## 2020-02-23 ENCOUNTER — Encounter: Payer: Self-pay | Admitting: Family Medicine

## 2020-02-23 VITALS — BP 130/82 | Ht 70.5 in | Wt 187.0 lb

## 2020-02-23 DIAGNOSIS — E785 Hyperlipidemia, unspecified: Secondary | ICD-10-CM

## 2020-02-23 DIAGNOSIS — I10 Essential (primary) hypertension: Secondary | ICD-10-CM

## 2020-02-23 DIAGNOSIS — F411 Generalized anxiety disorder: Secondary | ICD-10-CM

## 2020-02-23 DIAGNOSIS — R519 Headache, unspecified: Secondary | ICD-10-CM

## 2020-02-23 DIAGNOSIS — A6000 Herpesviral infection of urogenital system, unspecified: Secondary | ICD-10-CM | POA: Diagnosis not present

## 2020-02-23 DIAGNOSIS — K219 Gastro-esophageal reflux disease without esophagitis: Secondary | ICD-10-CM

## 2020-02-23 MED ORDER — ATORVASTATIN CALCIUM 10 MG PO TABS: 10.0000 mg | ORAL_TABLET | Freq: Every day | ORAL | 3 refills | Status: DC

## 2020-02-23 MED ORDER — IMIPRAMINE HCL 50 MG PO TABS: 50.0000 mg | ORAL_TABLET | Freq: Every day | ORAL | 3 refills | Status: DC

## 2020-02-23 MED ORDER — VALACYCLOVIR HCL 1 G PO TABS: 1000.0000 mg | ORAL_TABLET | Freq: Two times a day (BID) | ORAL | 3 refills | Status: AC | PRN

## 2020-02-23 MED ORDER — ALPRAZOLAM 1 MG PO TABS: ORAL_TABLET | ORAL | 1 refills | Status: DC

## 2020-02-23 MED ORDER — FLURBIPROFEN 100 MG PO TABS: ORAL_TABLET | ORAL | 5 refills | Status: DC

## 2020-02-23 MED ORDER — SERTRALINE HCL 100 MG PO TABS: ORAL_TABLET | ORAL | 3 refills | Status: DC

## 2020-02-23 MED ORDER — AMLODIPINE BESYLATE 10 MG PO TABS: 10.0000 mg | ORAL_TABLET | Freq: Every day | ORAL | 3 refills | Status: DC

## 2020-02-23 MED ORDER — TRIAMCINOLONE ACETONIDE 0.1 % EX CREA: 1.0000 "application " | TOPICAL_CREAM | Freq: Two times a day (BID) | CUTANEOUS | 1 refills | Status: AC

## 2020-02-23 NOTE — Patient Instructions (Addendum)
Serotonin Syndrome °Serotonin is a chemical in your body (neurotransmitter) that helps to control several functions, such as: °· Brain and nerve cell function. °· Mood and emotions. °· Memory. °· Eating. °· Sleeping. °· Sexual activity. °· Stress response. °Having too much serotonin in your body can cause serotonin syndrome. This condition can be harmful to your brain and nerve cells. This can be a life-threatening condition. °What are the causes? °This condition may be caused by taking medicines or drugs that increase the level of serotonin in your body, such as: °· Antidepressant medicines. °· Migraine medicines. °· Certain pain medicines. °· Certain drugs, including ecstasy, LSD, cocaine, and amphetamines. °· Over-the-counter cough or cold medicines that contain dextromethorphan. °· Certain herbal supplements, including St. John's wort, ginseng, and nutmeg. °This condition usually occurs when you take these medicines or drugs in combination, but it can also happen with a high dose of a single medicine or drug. °What increases the risk? °You are more likely to develop this condition if: °· You just started taking a medicine or drug that increases the level of serotonin in the body. °· You recently increased the dose of a medicine or drug that increases the level of serotonin in the body. °· You take more than one medicine or drug that increases the level of serotonin in the body. °What are the signs or symptoms? °Symptoms of this condition usually start within several hours of taking a medicine or drug. Symptoms may be mild or severe. Mild symptoms include: °· Sweating. °· Restlessness or agitation. °· Muscle twitching or stiffness. °· Rapid heart rate. °· Nausea and vomiting. °· Diarrhea. °· Headache. °· Shivering or goose bumps. °· Confusion. °Severe symptoms include: °· Irregular heartbeat. °· Seizures. °· Loss of consciousness. °· High fever. °How is this diagnosed? °This condition may be diagnosed based  on: °· Your medical history.  °· A physical exam. °· Your prior use of drugs and medicines. °· Blood or urine tests. These may be used to rule out other causes of your symptoms. °How is this treated? °The treatment for this condition depends on the severity of your symptoms. °· For mild cases, stopping the medicine or drug that caused your condition is usually all that is needed. °· For moderate to severe cases, treatment in a hospital may be needed to prevent or manage life-threatening symptoms. This may include medicines to control your symptoms, IV fluids, interventions to support your breathing, and treatments to control your body temperature. °Follow these instructions at home: °Medicines ° °· Take over-the-counter and prescription medicines only as told by your health care provider. This is important. °· Check with your health care provider before you start taking any new prescriptions, over-the-counter medicines, herbs, or supplements. °· Avoid combining any medicines that can cause this condition to occur. °Lifestyle ° °· Maintain a healthy lifestyle. °? Eat a healthy diet that includes plenty of vegetables, fruits, whole grains, low-fat dairy products, and lean protein. Do not eat a lot of foods that are high in fat, added sugars, or salt. °? Get the right amount and quality of sleep. Most adults need 7-9 hours of sleep each night. °? Make time to exercise, even if it is only for short periods of time. Most adults should exercise for at least 150 minutes each week. °? Do not drink alcohol. °? Do not use illegal drugs, and do not take medicines for reasons other than they are prescribed. °General instructions °· Do not use any products that contain nicotine   or tobacco, such as cigarettes and e-cigarettes. If you need help quitting, ask your health care provider. °· Keep all follow-up visits as told by your health care provider. This is important. °Contact a health care provider if: °· Your symptoms do not  improve or they get worse. °Get help right away if you: °· Have worsening confusion, severe headache, chest pain, high fever, seizures, or loss of consciousness. °· Experience serious side effects of medicine, such as swelling of your face, lips, tongue, or throat. °· Have serious thoughts about hurting yourself or others. °These symptoms may represent a serious problem that is an emergency. Do not wait to see if the symptoms will go away. Get medical help right away. Call your local emergency services (911 in the [place] Do not drive yourself to the hospital. °If you ever feel like you may hurt yourself or others, or have thoughts about taking your own life, get help right away. You can go to your nearest emergency department or call: °· Your local emergency services (911 in the [place] °· ·A suicide crisis helpline, such as the National Suicide Prevention Lifeline at 1-800-273-8255. This is open 24 hours a day. °Summary °· Serotonin is a brain chemical that helps to regulate the nervous system. High levels of serotonin in the body can cause serotonin syndrome, which is a very dangerous condition. °· This condition may be caused by taking medicines or drugs that increase the level of serotonin in your body. °· Treatment depends on the severity of your symptoms. For mild cases, stopping the medicine or drug that caused your condition is usually all that is needed. °· Check with your health care provider before you start taking any new prescriptions, over-the-counter medicines, herbs, or supplements. °This information is not intended to replace advice given to you by your health care provider. Make sure you discuss any questions you have with your health care provider. °Document Revised: 11/19/2017 Document Reviewed: 11/19/2017 °Elsevier Patient Education © 2020 [employer] ° °

## 2020-04-22 ENCOUNTER — Other Ambulatory Visit: Payer: Self-pay | Admitting: Family Medicine

## 2020-04-22 NOTE — Telephone Encounter (Signed)
Yes thanks- can load these up as 1x rx and write on them- short term fill as out of town and forgot meds at home

## 2020-04-22 NOTE — Telephone Encounter (Signed)
Pt called stating he is out of town for 3 days. Pt states he left his medication at home and is asking for a prescription of 4 days worth of pills. The medications are   Amlodipine 10 MG Atorvastatin 40 MG Sertraline 100 MG Imipramine 50 MG  Pt is also asking for a refill on his xanax 1 MG.    Pharmacy:   [facility]    [address] [place], [place]  [phone]

## 2020-04-22 NOTE — Telephone Encounter (Signed)
Is this even something we can do?

## 2020-04-23 MED ORDER — IMIPRAMINE HCL 50 MG PO TABS: 50.0000 mg | ORAL_TABLET | Freq: Every day | ORAL | 0 refills | Status: DC

## 2020-04-23 MED ORDER — SERTRALINE HCL 100 MG PO TABS: ORAL_TABLET | ORAL | 0 refills | Status: DC

## 2020-04-23 MED ORDER — AMLODIPINE BESYLATE 10 MG PO TABS: 10.0000 mg | ORAL_TABLET | Freq: Every day | ORAL | 0 refills | Status: DC

## 2020-04-23 MED ORDER — ATORVASTATIN CALCIUM 10 MG PO TABS: 10.0000 mg | ORAL_TABLET | Freq: Every day | ORAL | 0 refills | Status: DC

## 2020-04-23 MED ORDER — ALPRAZOLAM 1 MG PO TABS: ORAL_TABLET | ORAL | 0 refills | Status: DC

## 2020-04-23 NOTE — Telephone Encounter (Signed)
Sent to Dr. Durene Cal for refill.

## 2020-05-06 ENCOUNTER — Telehealth: Payer: Self-pay | Admitting: Family Medicine

## 2020-05-06 NOTE — Telephone Encounter (Signed)
Left message for patient to call back and schedule Medicare Annual Wellness Visit (AWV) either virtually/audio only OR in office. Whatever the patients preference is.  Last AWV 04/27/19; please schedule at anytime with [facility] Advisor at [facility].  This should be a 45 minute visit.   

## 2020-05-10 ENCOUNTER — Ambulatory Visit (INDEPENDENT_AMBULATORY_CARE_PROVIDER_SITE_OTHER): Payer: Medicare Other

## 2020-05-10 DIAGNOSIS — Z0000 Encounter for general adult medical examination without abnormal findings: Secondary | ICD-10-CM

## 2020-05-10 NOTE — Progress Notes (Signed)
Virtual Visit via Telephone Note  I connected with  [name] on 05/10/20 at  1:00 PM EDT by telephone and verified that I am speaking with the correct person using two identifiers.  Medicare Annual Wellness visit completed telephonically due to Covid-19 pandemic.   Persons participating in this call: This Health Coach and this patient.   Location: Patient: Home Provider: Office   I discussed the limitations, risks, security and privacy concerns of performing an evaluation and management service by telephone and the availability of in person appointments. The patient expressed understanding and agreed to proceed.  Unable to perform video visit due to video visit attempted and failed and/or patient does not have video capability.   Some vital signs may be absent or patient reported.   Duane Schlein, LPN    Subjective:   [name] is a 69 y.o. male who presents for Medicare Annual/Subsequent preventive examination.  Review of Systems     Cardiac Risk Factors include: male gender;dyslipidemia;hypertension     Objective:    There were no vitals filed for this visit. There is no height or weight on file to calculate BMI.  Advanced Directives 05/10/2020 08/22/2015 08/12/2015  Does Patient Have a Medical Advance Directive? Yes Yes Yes  Type of Estate agent of [place];Living will Healthcare Power of [employer] of [place];Living will  Copy of Healthcare Power of Attorney in Chart? No - copy requested - -    Current Medications (verified) Outpatient Encounter Medications as of 05/10/2020  Medication Sig   ALPRAZolam (XANAX) 1 MG tablet TAKE ONE TABLET BY MOUTH NIGHTLY AT BEDTIME AS NEEDED FOR ANXIETY   amLODipine (NORVASC) 10 MG tablet Take 1 tablet (10 mg total) by mouth daily.   atorvastatin (LIPITOR) 10 MG tablet Take 1 tablet (10 mg total) by mouth daily.   flurbiprofen (ANSAID) 100 MG tablet take 1 tablet by  mouth twice a day if needed for headache NO MORE THAN 4 DAYS PER WEEK   imipramine (TOFRANIL) 50 MG tablet Take 1 tablet (50 mg total) by mouth daily.   sertraline (ZOLOFT) 100 MG tablet TAKE 1 AND A 1/2 TABLETS BY MOUTH DAILY   triamcinolone cream (KENALOG) 0.1 % Apply 1 application topically 2 (two) times daily. For 7-10 days maximum for rash on leg   valACYclovir (VALTREX) 1000 MG tablet Take 1 tablet (1,000 mg total) by mouth 2 (two) times daily as needed (for 7-10 days with flare ups).   esomeprazole (NEXIUM) 40 MG capsule Take 1 capsule (40 mg total) by mouth daily as needed. (Patient not taking: Reported on 05/10/2020)   No facility-administered encounter medications on file as of 05/10/2020.    Allergies (verified) Horse-derived products   History: Past Medical History:  Diagnosis Date   Allergy    ANXIETY 06/15/2007   GERD 07/08/2007   HEMATOCHEZIA 02/15/2009   HERNIA, UMBILICAL 08/01/2007   HYPERLIPIDEMIA 04/11/2009   HYPERTENSION 07/08/2007   OBSTRUCTIVE SLEEP APNEA 07/08/2007   Sleep apnea    uses cpap   Past Surgical History:  Procedure Laterality Date   bone spur removal foot     pretty regular numbness after surgery   COLONOSCOPY  12-19-2004   EYE SURGERY     lasik   HAIR TRANSPLANT     TONSILLECTOMY AND ADENOIDECTOMY     UMBILICAL HERNIA REPAIR  2010   Family History  Problem Relation Age of Onset   Cancer Mother  esophageal, former smoker   Heart disease Mother [id number]       MI, former smoker   Esophageal cancer Mother    ALS Father    Lung cancer Brother        smoker   Multiple sclerosis Brother    Colon cancer Neg Hx    Colon polyps Neg Hx    Rectal cancer Neg Hx    Stomach cancer Neg Hx    Renal Disease Neg Hx    Other Neg Hx        pituitary disease   Social History   Socioeconomic History   Marital status: Married    Spouse name: Not on file   Number of children: Not on file   Years of education: Not on  file   Highest education level: Not on file  Occupational History   Occupation: retired  Tobacco Use   Smoking status: Former Smoker    Packs/day: 1.50    Years: 20.00    Pack years: 30.00    Types: Cigarettes    Quit date: 01/26/2001    Years since quitting: 19.2   Smokeless tobacco: Never Used   Tobacco comment: Encouraged to remain smoke free  Substance and Sexual Activity   Alcohol use: No    Alcohol/week: 0.0 standard drinks    Comment: alcoholic. Sparing drinks currently-once a month or less   Drug use: No   Sexual activity: Not on file  Other Topics Concern   Not on file  Social History Narrative   Married (wife patient outside practice). 2 daughters. 2 grandkids-1 boy and 1 girl.       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       Hobbies: guitar, reading, exercise   Social Determinants of Health   Financial Resource Strain: Low Risk    Difficulty of Paying Living Expenses: Not hard at all  Food Insecurity: No Food Insecurity   Worried About Programme researcher, broadcasting/film/videounning Out of Food in the Last Year: Never true   Baristaan Out of Food in the Last Year: Never true  Transportation Needs: No Transportation Needs   Lack of Transportation (Medical): No   Lack of Transportation (Non-Medical): No  Physical Activity: Sufficiently Active   Days of Exercise per Week: 3 days   Minutes of Exercise per Session: 60 min  Stress: No Stress Concern Present   Feeling of Stress : Not at all  Social Connections: Moderately Isolated   Frequency of Communication with Friends and Family: More than three times a week   Frequency of Social Gatherings with Friends and Family: More than three times a week   Attends Religious Services: Never   Database administratorActive Member of Clubs or Organizations: No   Attends Engineer, structuralClub or Organization Meetings: Never   Marital Status: Married    Tobacco Counseling Counseling given: Not Answered Comment:  Encouraged to remain smoke free   Clinical Intake:  Pre-visit preparation completed: Yes  Pain : No/denies pain     BMI - recorded: 26.45 Nutritional Status: BMI 25 -29 Overweight Nutritional Risks: None Diabetes: No  How often do you need to have someone help you when you read instructions, pamphlets, or other written materials from your doctor or pharmacy?: 1 - Never  Diabetic?No  Interpreter Needed?: No  Information entered by :: Lanier Ensignina Tyechia Allmendinger, LPN   Activities of Daily Living In your present state of health, do you have any difficulty performing the following activities: 05/10/2020 02/23/2020  Hearing? N N  Vision? N N  Difficulty concentrating or making decisions? N N  Walking or climbing stairs? N N  Dressing or bathing? N N  Doing errands, shopping? N N  Preparing Food and eating ? N -  Using the Toilet? N -  In the past six months, have you accidently leaked urine? N -  Do you have problems with loss of bowel control? N -  Managing your Medications? N -  Managing your Finances? N -  Housekeeping or managing your Housekeeping? N -  Some recent data might be hidden    Patient Care Team: Shelva Majestic, MD as PCP - General (Family Medicine) Durene Cal Aldine Contes, MD as Consulting Physician (Family Medicine)  Indicate any recent Medical Services you may have received from other than [facility] providers in the past year (date may be approximate).     Assessment:   This is a routine wellness examination for [place].  Hearing/Vision screen  Hearing Screening   125Hz  250Hz  500Hz  1000Hz  2000Hz  3000Hz  4000Hz  6000Hz  8000Hz   Right ear:           Left ear:           Comments: Pt denies hearing loss at this time  Vision Screening Comments: Wears eyeglasses for reading no follow up with eye Dr at this time  Dietary issues and exercise activities discussed: Current Exercise Habits: Home exercise routine, Time (Minutes): 60, Frequency (Times/Week): 3, Weekly Exercise  (Minutes/Week): 180, Intensity: Mild, Exercise limited by: None identified  Goals     Patient Stated     Managing anger       Depression Screen PHQ 2/9 Scores 05/10/2020 02/23/2020 04/11/2019 12/15/2017 12/15/2017 10/13/2016  PHQ - 2 Score 0 0 0 0 0 0  PHQ- 9 Score - 0 1 3 - -    Fall Risk Fall Risk  05/10/2020 04/11/2019 12/15/2017 10/13/2016  Falls in the past year? 0 0 No No  Number falls in past yr: 0 - - -  Injury with Fall? 0 - - -    Any stairs in or around the home? Yes  If so, are there any without handrails? No  Home free of loose throw rugs in walkways, pet beds, electrical cords, etc? Yes  Adequate lighting in your home to reduce risk of falls? Yes   ASSISTIVE DEVICES UTILIZED TO PREVENT FALLS:  Life alert? No  Use of a cane, walker or w/c? No  Grab bars in the bathroom? Yes  Shower chair or bench in shower? Yes  Elevated toilet seat or a handicapped toilet? No   TIMED UP AND GO:  Was the test performed? No   Cognitive Function:     6CIT Screen 05/10/2020  What Year? 0 points  What month? 0 points  What time? 0 points  Count back from 20 0 points  Months in reverse 0 points  Repeat phrase 2 points  Total Score 2    Immunizations Immunization History  Administered Date(s) Administered   Influenza Split 10/24/2012   Influenza, High Dose Seasonal PF 10/13/2016   Influenza,inj,Quad PF,6+ Mos 07/18/2013, 08/27/2015   Influenza-Unspecified 11/29/2017   PFIZER SARS-COV-2 Vaccination 12/11/2019, 01/01/2020   Pneumococcal Conjugate-13 10/13/2016   Pneumococcal Polysaccharide-23 12/15/2017   Td 04/11/2009   Tdap 10/21/2016   Zoster Recombinat (Shingrix) 03/20/2020    TDAP status: Up to date Flu Vaccine status: Up to date Pneumococcal vaccine status: Up to date Covid-19 vaccine status: Completed vaccines  Qualifies for Shingles Vaccine?  Yes   Zostavax completed No   Shingrix Completed?: Yes  Screening Tests Health Maintenance  Topic Date  Due   INFLUENZA VACCINE  05/26/2020   COLONOSCOPY  08/21/2025   TETANUS/TDAP  10/21/2026   COVID-19 Vaccine  Completed   Hepatitis C Screening  Completed   PNA vac Low Risk Adult  Completed    Health Maintenance  There are no preventive care reminders to display for this patient.  Colorectal cancer screening: Completed 08/22/15. Repeat every 10 years  Lung Cancer Screening: (Low Dose CT Chest recommended if Age 53-80 years, 30 pack-year currently smoking OR have quit w/in 15years.)  Additional Screening:  Hepatitis C Screening: Completed 10/14/16  Vision Screening: Recommended annual ophthalmology exams for early detection of glaucoma and other disorders of the eye. Is the patient up to date with their annual eye exam?  Yes, wears readers  Dental Screening: Recommended annual dental exams for proper oral hygiene  Community Resource Referral / Chronic Care Management: CRR required this visit?  No   CCM required this visit?  No      Plan:     I have personally reviewed and noted the following in the patients chart:    Medical and social history  Use of alcohol, tobacco or illicit drugs   Current medications and supplements  Functional ability and status  Nutritional status  Physical activity  Advanced directives  List of other physicians  Hospitalizations, surgeries, and ER visits in previous 12 months  Vitals  Screenings to include cognitive, depression, and falls  Referrals and appointments  In addition, I have reviewed and discussed with patient certain preventive protocols, quality metrics, and best practice recommendations. A written personalized care plan for preventive services as well as general preventive health recommendations were provided to patient.     Duane Schlein, LPN   7/67/2094   Nurse Notes: None

## 2020-05-10 NOTE — Patient Instructions (Addendum)
Mr. [name] , Thank you for taking time to come for your Medicare Wellness Visit. I appreciate your ongoing commitment to your health goals. Please review the following plan we discussed and let me know if I can assist you in the future.   Screening recommendations/referrals: Colonoscopy: done 08/22/15 Recommended yearly ophthalmology/optometry visit for glaucoma screening and checkup Recommended yearly dental visit for hygiene and checkup  Vaccinations: Influenza vaccine: Up to date Pneumococcal vaccine: Up to date Tdap vaccine: Up to date Shingles vaccine:1st dose 03/20/20  Covid-19: Completed 2/15 & 01/01/20  Advanced directives: Please bring a copy of your health care power of attorney and living will to the office at your convenience.  Conditions/risks identified:   Next appointment: Follow up in one year for your annual wellness visit.   Preventive Care 69 Years and Older, Male Preventive care refers to lifestyle choices and visits with your health care provider that can promote health and wellness. What does preventive care include?  A yearly physical exam. This is also called an annual well check.  Dental exams once or twice a year.  Routine eye exams. Ask your health care provider how often you should have your eyes checked.  Personal lifestyle choices, including:  Daily care of your teeth and gums.  Regular physical activity.  Eating a healthy diet.  Avoiding tobacco and drug use.  Limiting alcohol use.  Practicing safe sex.  Taking low doses of aspirin every day.  Taking vitamin and mineral supplements as recommended by your health care provider. What happens during an annual well check? The services and screenings done by your health care provider during your annual well check will depend on your age, overall health, lifestyle risk factors, and family history of disease. Counseling  Your health care provider may ask you questions about your:  Alcohol  use.  Tobacco use.  Drug use.  Emotional well-being.  Home and relationship well-being.  Sexual activity.  Eating habits.  History of falls.  Memory and ability to understand (cognition).  Work and work Astronomer. Screening  You may have the following tests or measurements:  Height, weight, and BMI.  Blood pressure.  Lipid and cholesterol levels. These may be checked every 5 years, or more frequently if you are over 25 years old.  Skin check.  Lung cancer screening. You may have this screening every year starting at age 71 if you have a 30-pack-year history of smoking and currently smoke or have quit within the past 15 years.  Fecal occult blood test (FOBT) of the stool. You may have this test every year starting at age 54.  Flexible sigmoidoscopy or colonoscopy. You may have a sigmoidoscopy every 5 years or a colonoscopy every 10 years starting at age 30.  Prostate cancer screening. Recommendations will vary depending on your family history and other risks.  Hepatitis C blood test.  Hepatitis B blood test.  Sexually transmitted disease (STD) testing.  Diabetes screening. This is done by checking your blood sugar (glucose) after you have not eaten for a while (fasting). You may have this done every 1-3 years.  Abdominal aortic aneurysm (AAA) screening. You may need this if you are a current or former smoker.  Osteoporosis. You may be screened starting at age 31 if you are at high risk. Talk with your health care provider about your test results, treatment options, and if necessary, the need for more tests. Vaccines  Your health care provider may recommend certain vaccines, such as:  Influenza vaccine.  This is recommended every year.  Tetanus, diphtheria, and acellular pertussis (Tdap, Td) vaccine. You may need a Td booster every 10 years.  Zoster vaccine. You may need this after age 50.  Pneumococcal 13-valent conjugate (PCV13) vaccine. One dose is  recommended after age 79.  Pneumococcal polysaccharide (PPSV23) vaccine. One dose is recommended after age 75. Talk to your health care provider about which screenings and vaccines you need and how often you need them. This information is not intended to replace advice given to you by your health care provider. Make sure you discuss any questions you have with your health care provider. Document Released: 11/08/2015 Document Revised: 07/01/2016 Document Reviewed: 08/13/2015 Elsevier Interactive Patient Education  2017 [employer] Prevention in the Home Falls can cause injuries. They can happen to people of all ages. There are many things you can do to make your home safe and to help prevent falls. What can I do on the outside of my home?  Regularly fix the edges of walkways and driveways and fix any cracks.  Remove anything that might make you trip as you walk through a door, such as a raised step or threshold.  Trim any bushes or trees on the path to your home.  Use bright outdoor lighting.  Clear any walking paths of anything that might make someone trip, such as rocks or tools.  Regularly check to see if handrails are loose or broken. Make sure that both sides of any steps have handrails.  Any raised decks and porches should have guardrails on the edges.  Have any leaves, snow, or ice cleared regularly.  Use sand or salt on walking paths during winter.  Clean up any spills in your garage right away. This includes oil or grease spills. What can I do in the bathroom?  Use night lights.  Install grab bars by the toilet and in the tub and shower. Do not use towel bars as grab bars.  Use non-skid mats or decals in the tub or shower.  If you need to sit down in the shower, use a plastic, non-slip stool.  Keep the floor dry. Clean up any water that spills on the floor as soon as it happens.  Remove soap buildup in the tub or shower regularly.  Attach bath mats  securely with double-sided non-slip rug tape.  Do not have throw rugs and other things on the floor that can make you trip. What can I do in the bedroom?  Use night lights.  Make sure that you have a light by your bed that is easy to reach.  Do not use any sheets or blankets that are too big for your bed. They should not hang down onto the floor.  Have a firm chair that has side arms. You can use this for support while you get dressed.  Do not have throw rugs and other things on the floor that can make you trip. What can I do in the kitchen?  Clean up any spills right away.  Avoid walking on wet floors.  Keep items that you use a lot in easy-to-reach places.  If you need to reach something above you, use a strong step stool that has a grab bar.  Keep electrical cords out of the way.  Do not use floor polish or wax that makes floors slippery. If you must use wax, use non-skid floor wax.  Do not have throw rugs and other things on the floor that can make  you trip. What can I do with my stairs?  Do not leave any items on the stairs.  Make sure that there are handrails on both sides of the stairs and use them. Fix handrails that are broken or loose. Make sure that handrails are as long as the stairways.  Check any carpeting to make sure that it is firmly attached to the stairs. Fix any carpet that is loose or worn.  Avoid having throw rugs at the top or bottom of the stairs. If you do have throw rugs, attach them to the floor with carpet tape.  Make sure that you have a light switch at the top of the stairs and the bottom of the stairs. If you do not have them, ask someone to add them for you. What else can I do to help prevent falls?  Wear shoes that:  Do not have high heels.  Have rubber bottoms.  Are comfortable and fit you well.  Are closed at the toe. Do not wear sandals.  If you use a stepladder:  Make sure that it is fully opened. Do not climb a closed  stepladder.  Make sure that both sides of the stepladder are locked into place.  Ask someone to hold it for you, if possible.  Clearly mark and make sure that you can see:  Any grab bars or handrails.  First and last steps.  Where the edge of each step is.  Use tools that help you move around (mobility aids) if they are needed. These include:  Canes.  Walkers.  Scooters.  Crutches.  Turn on the lights when you go into a dark area. Replace any light bulbs as soon as they burn out.  Set up your furniture so you have a clear path. Avoid moving your furniture around.  If any of your floors are uneven, fix them.  If there are any pets around you, be aware of where they are.  Review your medicines with your doctor. Some medicines can make you feel dizzy. This can increase your chance of falling. Ask your doctor what other things that you can do to help prevent falls. This information is not intended to replace advice given to you by your health care provider. Make sure you discuss any questions you have with your health care provider. Document Released: 08/08/2009 Document Revised: 03/19/2016 Document Reviewed: 11/16/2014 Elsevier Interactive Patient Education  2017 [employer].

## 2020-05-14 DIAGNOSIS — F4323 Adjustment disorder with mixed anxiety and depressed mood: Secondary | ICD-10-CM | POA: Diagnosis not present

## 2020-05-22 DIAGNOSIS — F4323 Adjustment disorder with mixed anxiety and depressed mood: Secondary | ICD-10-CM | POA: Diagnosis not present

## 2020-07-05 DIAGNOSIS — F4323 Adjustment disorder with mixed anxiety and depressed mood: Secondary | ICD-10-CM | POA: Diagnosis not present

## 2020-07-19 ENCOUNTER — Other Ambulatory Visit: Payer: Self-pay | Admitting: Family Medicine

## 2020-07-19 DIAGNOSIS — Z23 Encounter for immunization: Secondary | ICD-10-CM | POA: Diagnosis not present

## 2020-07-29 DIAGNOSIS — F4323 Adjustment disorder with mixed anxiety and depressed mood: Secondary | ICD-10-CM | POA: Diagnosis not present

## 2020-08-06 ENCOUNTER — Other Ambulatory Visit: Payer: Self-pay | Admitting: Family Medicine

## 2020-08-06 DIAGNOSIS — F4323 Adjustment disorder with mixed anxiety and depressed mood: Secondary | ICD-10-CM | POA: Diagnosis not present

## 2020-08-15 DIAGNOSIS — F4323 Adjustment disorder with mixed anxiety and depressed mood: Secondary | ICD-10-CM | POA: Diagnosis not present

## 2020-08-21 ENCOUNTER — Encounter: Payer: Self-pay | Admitting: Family Medicine

## 2020-09-16 ENCOUNTER — Other Ambulatory Visit: Payer: Self-pay | Admitting: Family Medicine

## 2020-10-14 ENCOUNTER — Other Ambulatory Visit: Payer: Self-pay | Admitting: Family Medicine

## 2020-10-14 ENCOUNTER — Encounter: Payer: Self-pay | Admitting: Family Medicine

## 2020-10-14 MED ORDER — ALPRAZOLAM 1 MG PO TABS: 1.0000 mg | ORAL_TABLET | Freq: Every evening | ORAL | 5 refills | Status: DC | PRN

## 2020-10-15 ENCOUNTER — Other Ambulatory Visit: Payer: Self-pay

## 2020-10-15 ENCOUNTER — Other Ambulatory Visit: Payer: Self-pay | Admitting: Family Medicine

## 2020-10-15 MED ORDER — ALPRAZOLAM 1 MG PO TABS: 1.0000 mg | ORAL_TABLET | Freq: Every evening | ORAL | 5 refills | Status: AC | PRN

## 2020-10-15 MED ORDER — ALPRAZOLAM 1 MG PO TABS: 1.0000 mg | ORAL_TABLET | Freq: Every evening | ORAL | 5 refills | Status: DC | PRN

## 2020-10-15 NOTE — Telephone Encounter (Signed)
Please clarify-I E prescribed this on 10/14/2020-[facility] told me that they would not accept E prescribe so she printed a copy and this was faxed today

## 2020-10-15 NOTE — Telephone Encounter (Signed)
LR: 10-15-2020(script was printed instead of e-scribe) Qty: 30 with 5 refills  Last office visit: 02-23-2020 (video visit) Upcoming appointment: No pending appt

## 2020-10-23 DIAGNOSIS — E559 Vitamin D deficiency, unspecified: Secondary | ICD-10-CM | POA: Diagnosis not present

## 2020-10-23 DIAGNOSIS — F39 Unspecified mood [affective] disorder: Secondary | ICD-10-CM | POA: Diagnosis not present

## 2020-10-23 DIAGNOSIS — G4733 Obstructive sleep apnea (adult) (pediatric): Secondary | ICD-10-CM | POA: Diagnosis not present

## 2020-10-23 DIAGNOSIS — Z6825 Body mass index (BMI) 25.0-25.9, adult: Secondary | ICD-10-CM | POA: Diagnosis not present

## 2020-10-23 DIAGNOSIS — E782 Mixed hyperlipidemia: Secondary | ICD-10-CM | POA: Diagnosis not present

## 2020-10-23 DIAGNOSIS — F3341 Major depressive disorder, recurrent, in partial remission: Secondary | ICD-10-CM | POA: Diagnosis not present

## 2020-10-23 DIAGNOSIS — I10 Essential (primary) hypertension: Secondary | ICD-10-CM | POA: Diagnosis not present

## 2020-10-23 DIAGNOSIS — F411 Generalized anxiety disorder: Secondary | ICD-10-CM | POA: Diagnosis not present

## 2020-10-23 DIAGNOSIS — Z23 Encounter for immunization: Secondary | ICD-10-CM | POA: Diagnosis not present

## 2020-10-23 DIAGNOSIS — G44209 Tension-type headache, unspecified, not intractable: Secondary | ICD-10-CM | POA: Diagnosis not present

## 2020-11-10 ENCOUNTER — Other Ambulatory Visit: Payer: Self-pay | Admitting: Family Medicine

## 2021-02-14 ENCOUNTER — Other Ambulatory Visit: Payer: Self-pay | Admitting: Family Medicine

## 2021-03-10 ENCOUNTER — Other Ambulatory Visit: Payer: Self-pay | Admitting: Family Medicine

## 2021-05-07 ENCOUNTER — Other Ambulatory Visit: Payer: Self-pay | Admitting: Family Medicine

## 2021-05-07 NOTE — Telephone Encounter (Signed)
Lives in [place]- he would need an office visit or at least virtual visit for refill but also would advise establishing with new physician in that area

## 2021-05-09 NOTE — Telephone Encounter (Signed)
LVM for patient to call back and scheduled if needed.

## 2021-07-25 ENCOUNTER — Other Ambulatory Visit: Payer: Self-pay | Admitting: Family Medicine

## 2021-10-06 ENCOUNTER — Other Ambulatory Visit: Payer: Self-pay | Admitting: Family Medicine

## 2021-10-09 ENCOUNTER — Other Ambulatory Visit: Payer: Self-pay | Admitting: Family Medicine

## 2021-11-06 ENCOUNTER — Other Ambulatory Visit: Payer: Self-pay | Admitting: Family Medicine

## 2022-01-18 ENCOUNTER — Other Ambulatory Visit: Payer: Self-pay | Admitting: Family Medicine

## 2023-01-28 ENCOUNTER — Other Ambulatory Visit: Payer: Self-pay | Admitting: Family Medicine
